# Patient Record
Sex: Male | Born: 1990 | Race: White | Hispanic: No | Marital: Single | Smoking: Never smoker
Health system: Southern US, Community
[De-identification: ages and names within clinical notes are randomized; demographics above are authoritative.]

---
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
## 2012-07-22 ENCOUNTER — Emergency Department (HOSPITAL_COMMUNITY): Payer: Self-pay

## 2012-07-22 ENCOUNTER — Encounter (HOSPITAL_COMMUNITY): Payer: Self-pay | Admitting: Emergency Medicine

## 2012-07-22 ENCOUNTER — Emergency Department (HOSPITAL_COMMUNITY)
Admission: EM | Admit: 2012-07-22 | Discharge: 2012-07-22 | Disposition: A | Discharge status: Home / Self Care | Payer: Self-pay | Attending: Emergency Medicine | Admitting: Emergency Medicine

## 2012-07-22 DIAGNOSIS — R0602 Shortness of breath: Secondary | ICD-10-CM | POA: Insufficient documentation

## 2012-07-22 LAB — POCT I-STAT, CHEM 8
BUN: 14 mg/dL (ref 6–23)
Calcium, Ion: 1.22 mmol/L (ref 1.12–1.23)
Chloride: 106 mEq/L (ref 96–112)
Creatinine, Ser: 0.7 mg/dL (ref 0.50–1.35)
Glucose, Bld: 103 mg/dL — ABNORMAL HIGH (ref 70–99)
HCT: 50 % (ref 39.0–52.0)
Hemoglobin: 17 g/dL (ref 13.0–17.0)
Potassium: 3.7 mEq/L (ref 3.5–5.1)
Sodium: 140 mEq/L (ref 135–145)
TCO2: 24 mmol/L (ref 0–100)

## 2012-07-22 LAB — D-DIMER, QUANTITATIVE (NOT AT ARMC): D-Dimer, Quant: 0.84 ug/mL-FEU — ABNORMAL HIGH (ref 0.00–0.48)

## 2012-07-22 MED ORDER — PREDNISONE 50 MG PO TABS: ORAL_TABLET | ORAL | Status: DC

## 2012-07-22 MED ORDER — ALBUTEROL SULFATE (5 MG/ML) 0.5% IN NEBU
5.0000 mg | INHALATION_SOLUTION | Freq: Once | RESPIRATORY_TRACT | Status: AC
  Administered 2012-07-22: 5 mg via RESPIRATORY_TRACT
  Filled 2012-07-22: qty 1

## 2012-07-22 MED ORDER — ALBUTEROL SULFATE HFA 108 (90 BASE) MCG/ACT IN AERS
2.0000 | INHALATION_SPRAY | RESPIRATORY_TRACT | Status: DC | PRN
  Administered 2012-07-22: 2 via RESPIRATORY_TRACT
  Filled 2012-07-22: qty 6.7

## 2012-07-22 MED ORDER — PREDNISONE 50 MG PO TABS
50.0000 mg | ORAL_TABLET | Freq: Once | ORAL | Status: AC
  Administered 2012-07-22: 50 mg via ORAL
  Filled 2012-07-22: qty 1

## 2012-07-22 MED ORDER — IOHEXOL 350 MG/ML SOLN
100.0000 mL | Freq: Once | INTRAVENOUS | Status: AC | PRN
  Administered 2012-07-22: 100 mL via INTRAVENOUS

## 2012-07-22 NOTE — ED Notes (Signed)
Patient transported to CT 

## 2012-07-22 NOTE — Progress Notes (Signed)
[facility] has seen patient and provided him with a list of primary care resources. °

## 2012-07-22 NOTE — ED Provider Notes (Signed)
History     CSN: [id number]  Arrival date & time 07/22/12  0865   First MD Initiated Contact with Patient 07/22/12 [phone]      Chief Complaint  Patient presents with  . Shortness of Breath    (Consider location/radiation/quality/duration/timing/severity/associated sxs/prior treatment) HPI Patient presents emergency room with complaints of shortness of breath, that occurred this morning.  Patient, states, that she's not had any cough, fever, nausea, vomiting, abdominal pain, chest pain, back pain, blurred vision, photophobia, weakness, or numbness.  Patient, states, that he did not take any medications prior to arrival.  Patient, states nothing seems to make his condition, better or worse. History reviewed. No pertinent past medical history.  History reviewed. No pertinent past surgical history.  No family history on file.  History  Substance Use Topics  . Smoking status: Never Smoker   . Smokeless tobacco: Not on file  . Alcohol Use: No      Review of Systems All other systems negative except as documented in the HPI. All pertinent positives and negatives as reviewed in the HPI. Allergies  Review of patient's allergies indicates not on file.  Home Medications  No current outpatient prescriptions on file.  BP 127/74  Pulse 81  Temp(Src) 98 F (36.7 C)  Resp 18  Ht 5\' 9"  (1.753 m)  Wt 190 lb (86.183 kg)  BMI 28.05 kg/m2  SpO2 99%  Physical Exam  Nursing note and vitals reviewed. Constitutional: He is oriented to person, place, and time. He appears well-developed and well-nourished.  HENT:  Head: Normocephalic and atraumatic.  Mouth/Throat: Oropharynx is clear and moist.  Eyes: Pupils are equal, round, and reactive to light.  Neck: Normal range of motion. Neck supple.  Cardiovascular: Normal rate, regular rhythm and normal heart sounds.  Exam reveals no gallop and no friction rub.   No murmur heard. Pulmonary/Chest: Effort normal and breath sounds normal. No  respiratory distress. He has no wheezes. He has no rales. He exhibits no tenderness.  Musculoskeletal: He exhibits no edema.  Neurological: He is alert and oriented to person, place, and time.  Skin: Skin is warm and dry. No rash noted.    ED Course  Procedures (including critical care time)  Labs Reviewed  D-DIMER, QUANTITATIVE - Abnormal; Notable for the following:    D-Dimer, Quant 0.84 (*)    All other components within normal limits  POCT I-STAT, CHEM 8 - Abnormal; Notable for the following:    Glucose, Bld 103 (*)    All other components within normal limits   Dg Chest 2 View  07/22/2012   *RADIOLOGY REPORT*  Clinical Data: Cough, shortness of breath and chest pain.  CHEST - 2 VIEW  Comparison: Chest x-ray from a rib series dated 08/08/2007.  Findings: There is no evidence of pneumothorax, infiltrate, edema or pleural fluid.  Cardiac and mediastinal contours are within normal limits.  Bony thorax is unremarkable.  IMPRESSION: No active disease.   Original Report Authenticated By: Irish Lack, M.D.   Ct Angio Chest Pe W/cm &/or Wo Cm  07/22/2012   *RADIOLOGY REPORT*  Clinical Data: Chest pain, cough, shortness of breath  CT ANGIOGRAPHY CHEST  Technique:  Multidetector CT imaging of the chest using the standard protocol during bolus administration of intravenous contrast. Multiplanar reconstructed images including MIPs were obtained and reviewed to evaluate the vascular anatomy.  Contrast: OMNIPAQUE IOHEXOL 350 MG/ML SOLN  Comparison: Chest radiographs dated 08/01/2012  Findings: No evidence of pulmonary embolism.  The lungs  are clear.  No focal consolidation.  No pleural effusion or pneumothorax.  Visualized thyroid is unremarkable.  The heart is normal in size.  No pericardial effusion.  The no suspicious mediastinal, hilar, or axillary lymphadenopathy.  Visualized upper abdomen is unremarkable.  Visualized osseous structures are within normal limits.  IMPRESSION: No evidence of  pulmonary embolism.  No evidence of acute cardiopulmonary disease.   Original Report Authenticated By: Charline Bills, M.D.    Patient has a negative CT of his chest .  For pulmonary embolus or other acute findings.  Patient does not have any wheezing, and a, states, that coughing, some.  He is given a neb treatment and steroid to attempt to ensure that his airways are completely open.  Patient's best return here as needed.  Told to followup with his primary care Dr.     MDM          Carlyle Dolly, PA-C 07/22/12 1409

## 2012-07-22 NOTE — ED Notes (Signed)
Pt states he has been short of breath for about 1 week and now states that today it has gotten worse. Denies any pain or other symptoms.

## 2012-07-26 NOTE — ED Provider Notes (Signed)
Medical screening examination/treatment/procedure(s) were performed by non-physician practitioner and as supervising physician I was immediately available for consultation/collaboration.  Toy Baker, MD 07/26/12 2011

## 2012-10-21 ENCOUNTER — Ambulatory Visit: Payer: Self-pay | Attending: Internal Medicine

## 2012-11-20 ENCOUNTER — Emergency Department (HOSPITAL_COMMUNITY)
Admission: EM | Admit: 2012-11-20 | Discharge: 2012-11-20 | Disposition: A | Discharge status: Home / Self Care | Payer: PRIVATE HEALTH INSURANCE | Source: Home / Self Care | Attending: Emergency Medicine | Admitting: Emergency Medicine

## 2012-11-20 ENCOUNTER — Encounter (HOSPITAL_COMMUNITY): Payer: Self-pay | Admitting: *Deleted

## 2012-11-20 DIAGNOSIS — B360 Pityriasis versicolor: Secondary | ICD-10-CM

## 2012-11-20 MED ORDER — KETOCONAZOLE 2 % EX CREA: TOPICAL_CREAM | Freq: Every day | CUTANEOUS | Status: AC

## 2012-11-20 NOTE — ED Provider Notes (Signed)
CSN: [id number]     Arrival date & time 11/20/12  1601 History   First MD Initiated Contact with Patient 11/20/12 1704     Chief Complaint  Patient presents with  . Rash   (Consider location/radiation/quality/duration/timing/severity/associated sxs/prior Treatment) HPI Comments: Patient reports history of an itching rash that started on bilateral upper arms approximately 2 weeks ago. Patient states he does not have a primary care provider.  Patient is a 22 y.o. male presenting with rash. The history is provided by the patient.  Rash   History reviewed. No pertinent past medical history. History reviewed. No pertinent past surgical history. History reviewed. No pertinent family history. History  Substance Use Topics  . Smoking status: Never Smoker   . Smokeless tobacco: Not on file  . Alcohol Use: No    Review of Systems  Constitutional: Negative.        Patient denies history of recent illness, malaise, fever, nausea, vomiting or diarrhea.  HENT: Negative.   Eyes: Negative.   Respiratory: Negative.   Cardiovascular: Negative.   Gastrointestinal: Negative.   Endocrine: Negative.   Genitourinary: Negative.   Musculoskeletal: Negative.   Skin: Positive for rash.       Patient states that the rash has spread to his arms to his back and chest. Patient states it is itchy.  Allergic/Immunologic: Negative.   Neurological: Negative.   Hematological: Negative.   Psychiatric/Behavioral: Negative.     Allergies  Review of patient's allergies indicates not on file.  Home Medications   Current Outpatient Rx  Name  Route  Sig  Dispense  Refill  . ketoconazole (NIZORAL) 2 % cream   Topical   Apply topically daily.   30 g   0   . predniSONE (DELTASONE) 50 MG tablet      1 tablet a day in the morning   5 tablet   0    BP 140/65  Pulse 82  Temp(Src) 98.2 F (36.8 C) (Oral)  Resp 18  SpO2 100%   Physical Exam  Nursing note and vitals reviewed. Constitutional: He is  oriented to person, place, and time. He appears well-developed and well-nourished. No distress.  HENT:  Head: Normocephalic and atraumatic.  Neck: Normal range of motion. Neck supple. No tracheal deviation present.  Cardiovascular: Normal rate, regular rhythm, normal heart sounds and intact distal pulses.  Exam reveals no gallop and no friction rub.   No murmur heard. Pulmonary/Chest: Effort normal and breath sounds normal. No respiratory distress. He has no wheezes. He has no rales. He exhibits no tenderness.  Lymphadenopathy:    He has no cervical adenopathy.  Neurological: He is alert and oriented to person, place, and time.  Skin: Skin is warm and dry. He is not diaphoretic.  Moderate erythema noted to right shoulder, patient states he has been scratching due to the intense itching. Large area resembling a  "Herald patch" on the right thoracic region of back. Stating that he looks at his back daily and that it absolutely started on his shoulders and upper arms.  Patient denies the presence of herald patch prior to development of other patches.  Rash is macular with hypopigmented areas surrounded by border of erythema.  It is scattered across shoulders and upper posterior surface of the arms and across the upper back. Mild flaking of skin noted.    ED Course  Procedures (including critical care time) Labs Review Labs Reviewed - No data to display Imaging Review No results found.  MDM  1. Tinea versicolor    Meds ordered this encounter  Medications  . ketoconazole (NIZORAL) 2 % cream    Sig: Apply topically daily.    Dispense:  30 g    Refill:  0   Plan of care discussed with patient and patient verbalizes understanding of the need for daily application for a full 2 weeks.  Pt to followup with dermatologist or [facility] if no improvement within the next 2 weeks.    Weber Cooks, NP 11/20/12 1737

## 2012-11-20 NOTE — ED Notes (Signed)
Pt  Has  A  Rash  On r  Upper  Arm      X  sev  Weeks  That  Does  Not    Itch          He  Also  Reports      A  Small  Cyst  On  The  l  Wrist     That is  Not  Painfull

## 2012-11-20 NOTE — ED Provider Notes (Signed)
Medical screening examination/treatment/procedure(s) were performed by non-physician practitioner and as supervising physician I was immediately available for consultation/collaboration.  Jamale Spangler, M.D.  Chrishauna Mee C Rheba Diamond, MD 11/20/12 1925 

## 2013-04-25 ENCOUNTER — Ambulatory Visit: Payer: Self-pay | Attending: Internal Medicine | Admitting: Internal Medicine

## 2013-04-25 ENCOUNTER — Encounter: Payer: Self-pay | Admitting: Internal Medicine

## 2013-04-25 VITALS — BP 145/75 | HR 71 | Temp 97.9°F | Resp 16 | Ht 69.0 in | Wt 177.0 lb

## 2013-04-25 DIAGNOSIS — B360 Pityriasis versicolor: Secondary | ICD-10-CM

## 2013-04-25 LAB — CBC WITH DIFFERENTIAL/PLATELET
Basophils Absolute: 0 10*3/uL (ref 0.0–0.1)
Basophils Relative: 0 % (ref 0–1)
Eosinophils Absolute: 0.2 10*3/uL (ref 0.0–0.7)
Eosinophils Relative: 4 % (ref 0–5)
HCT: 44.6 % (ref 39.0–52.0)
Hemoglobin: 15.5 g/dL (ref 13.0–17.0)
Lymphocytes Relative: 38 % (ref 12–46)
Lymphs Abs: 1.7 10*3/uL (ref 0.7–4.0)
MCH: 31.3 pg (ref 26.0–34.0)
MCHC: 34.8 g/dL (ref 30.0–36.0)
MCV: 90.1 fL (ref 78.0–100.0)
Monocytes Absolute: 0.5 10*3/uL (ref 0.1–1.0)
Monocytes Relative: 10 % (ref 3–12)
Neutro Abs: 2.2 10*3/uL (ref 1.7–7.7)
Neutrophils Relative %: 48 % (ref 43–77)
Platelets: 196 10*3/uL (ref 150–400)
RBC: 4.95 MIL/uL (ref 4.22–5.81)
RDW: 14 % (ref 11.5–15.5)
WBC: 4.6 10*3/uL (ref 4.0–10.5)

## 2013-04-25 LAB — SEDIMENTATION RATE: Sed Rate: 1 mm/hr (ref 0–16)

## 2013-04-25 LAB — TSH: TSH: 2.105 u[IU]/mL (ref 0.350–4.500)

## 2013-04-25 MED ORDER — TERBINAFINE HCL 1 % EX CREA: 1.0000 "application " | TOPICAL_CREAM | Freq: Two times a day (BID) | CUTANEOUS | Status: DC

## 2013-04-25 NOTE — Progress Notes (Signed)
Patient ID: Bryan Curry, male   DOB: Sep 19, 1990, 23 y.o.   MRN: [id number]   Bryan Curry, is a 23 y.o. male  [id number]:[id number]  [id number]:[id number]  DOB - 03-Dec-1990  CC:  Chief Complaint  Patient presents with  . Establish Care       HPI: Bryan Curry is a 23 y.o. male here today to establish medical care. Patient was recently seen in ER for skin rashes and was prescribed a tapered dose of prednisone but to no avail, he was also given Nizoral cream which did not help at all. Patient is here to establish care and to complain of the same rashes on the upper arms bilaterally, upper back and sometimes upper chest. Occasionally itchy, started about 6 months ago, sometimes comes and goes but lately has been persistent. No similar rash in other members of family, patient denies any contact. No joint pains or swellings. Patient also complained of occasional dizziness and generalized tiredness. No cold intolerance or heat intolerance, no personal history of autoimmune disorder or family history of such. Patient's symptoms has no pattern, no known relieving or aggravating factors. In the past few days no symptoms at all, patient is not able to predict when symptom comes but are uncomfortable feeling. Patient has No headache, No chest pain, No abdominal pain - No Nausea, No new weakness tingling or numbness, No Cough - SOB.  No Known Allergies History reviewed. No pertinent past medical history. Current Outpatient Prescriptions on File Prior to Visit  Medication Sig Dispense Refill  . predniSONE (DELTASONE) 50 MG tablet 1 tablet a day in the morning  5 tablet  0   No current facility-administered medications on file prior to visit.   Family History  Problem Relation Age of Onset  . Cancer Paternal Grandfather    History   Social History  . Marital Status: Single    Spouse Name: N/A    Number of Children: N/A  . Years of Education: N/A   Occupational History  . Not on file.   Social  History Main Topics  . Smoking status: Never Smoker   . Smokeless tobacco: Not on file  . Alcohol Use: No  . Drug Use: No  . Sexual Activity: Yes   Other Topics Concern  . Not on file   Social History Narrative  . No narrative on file    Review of Systems: Constitutional: Negative for fever, chills, diaphoresis, activity change, appetite change and fatigue. HENT: Negative for ear pain, nosebleeds, congestion, facial swelling, rhinorrhea, neck pain, neck stiffness and ear discharge.  Eyes: Negative for pain, discharge, redness, itching and visual disturbance. Respiratory: Negative for cough, choking, chest tightness, shortness of breath, wheezing and stridor.  Cardiovascular: Negative for chest pain, palpitations and leg swelling. Gastrointestinal: Negative for abdominal distention. Genitourinary: Negative for dysuria, urgency, frequency, hematuria, flank pain, decreased urine volume, difficulty urinating and dyspareunia.  Musculoskeletal: Negative for back pain, joint swelling, arthralgia and gait problem. Neurological: Negative for dizziness, tremors, seizures, syncope, facial asymmetry, speech difficulty, weakness, light-headedness, numbness and headaches.  Hematological: Negative for adenopathy. Does not bruise/bleed easily. Psychiatric/Behavioral: Negative for hallucinations, behavioral problems, confusion, dysphoric mood, decreased concentration and agitation.    Objective:   Filed Vitals:   04/25/13 1028  BP: 145/75  Pulse: 71  Temp: 97.9 F (36.6 C)  Resp: 16    Physical Exam: Constitutional: Patient appears well-developed and well-nourished. No distress. HENT: Normocephalic, atraumatic, External right and left ear normal. Oropharynx is clear and moist.  Eyes: Conjunctivae and EOM are normal. PERRLA, no scleral icterus. Neck: Normal ROM. Neck supple. No JVD. No tracheal deviation. No thyromegaly. CVS: RRR, S1/S2 +, no murmurs, no gallops, no carotid bruit.    Pulmonary: Effort and breath sounds normal, no stridor, rhonchi, wheezes, rales.  Abdominal: Soft. BS +, no distension, tenderness, rebound or guarding.  Musculoskeletal: Normal range of motion. No edema and no tenderness.  Lymphadenopathy: No lymphadenopathy noted, cervical, inguinal or axillary Neuro: Alert. Normal reflexes, muscle tone coordination. No cranial nerve deficit. Skin: There are macular rashes with hypopigmentations surrounded by border of erythema. It is scattered across shoulders and upper posterior surface of the arms and across the upper back. Mild flaking of skin noted.  Skin is warm and dry. No rash noted. Not diaphoretic. No erythema. No pallor. Psychiatric: Normal mood and affect. Behavior, judgment, thought content normal.  Lab Results  Component Value Date   HGB 17.0 07/22/2012   HCT 50.0 07/22/2012   Lab Results  Component Value Date   CREATININE 0.70 07/22/2012   BUN 14 07/22/2012   NA 140 07/22/2012   K 3.7 07/22/2012   CL 106 07/22/2012    No results found for this basename: HGBA1C   Lipid Panel  No results found for this basename: chol, trig, hdl, cholhdl, vldl, ldlcalc       Assessment and plan:   1. Tinea versicolor  - terbinafine (LAMISIL) 1 % cream; Apply 1 application topically 2 (two) times daily.  Dispense: 30 g; Refill: 2 - CBC with Differential - Sedimentation Rate - ANA - TSH  Patient has been extensively counseled about personal hygiene Patient was instructed to return if symptoms of dizziness or chest discomfort reoccurs  Return in about 6 months (around 10/26/2013), or if symptoms worsen or fail to improve, for Skin Disease.  The patient was given clear instructions to go to ER or return to medical center if symptoms don't improve, worsen or new problems develop. The patient verbalized understanding. The patient was told to call to get lab results if they haven't heard anything in the next week.     This note has been created with [employer]  speech recognition software and smart phrase technology. Any transcriptional errors are unintentional.    Jeanann LewandowskyJEGEDE, Jillaine Waren, MD, MHA, FACP, FAAP [facility] [place], [place] [phone]   04/25/2013, 11:43 AM

## 2013-04-25 NOTE — Patient Instructions (Signed)
Tinea Versicolor  Tinea versicolor is a common yeast infection of the skin. This condition becomes known when the yeast on our skin starts to overgrow (yeast is a normal inhabitant on our skin). This condition is noticed as white or light brown patches on brown skin, and is more evident in the summer on tanned skin. These areas are slightly scaly if scratched. The light patches from the yeast become evident when the yeast creates "holes in your suntan". This is most often noticed in the summer. The patches are usually located on the chest, back, pubis, neck and body folds. However, it may occur on any area of body. Mild itching and inflammation (redness or soreness) may be present.  DIAGNOSIS   The diagnosisof this is made clinically (by looking). Cultures from samples are usually not needed. Examination under the microscope may help. However, yeast is normally found on skin. The diagnosis still remains clinical. Examination under Wood's Ultraviolet Light can determine the extent of the infection.  TREATMENT   This common infection is usually only of cosmetic (only a concern to your appearance). It is easily treated with dandruff shampoo used during showers or bathing. Vigorous scrubbing will eliminate the yeast over several days time. The light areas in your skin may remain for weeks or months after the infection is cured unless your skin is exposed to sunlight. The lighter or darker spots caused by the fungus that remain after complete treatment are not a sign of treatment failure; it will take a long time to resolve. Your caregiver may recommend a number of commercial preparations or medication by mouth if home care is not working. Recurrence is common and preventative medication may be necessary.  This skin condition is not highly contagious. Special care is not needed to protect close friends and family members. Normal hygiene is usually enough. Follow up is required only if you develop complications (such as a  secondary infection from scratching), if recommended by your caregiver, or if no relief is obtained from the preparations used.  Document Released: 01/31/2000 Document Revised: 04/27/2011 Document Reviewed: 03/14/2008  Exit[employer] Patient Information 2014 [employer].

## 2013-04-25 NOTE — Progress Notes (Signed)
Pt is here to establish care. Pt reports having trouble breathing. Pt has discolored white spot on his arms. He also has a painful knot on his wrist.

## 2013-04-26 LAB — ANA: Anti Nuclear Antibody(ANA): NEGATIVE

## 2013-05-02 ENCOUNTER — Telehealth: Payer: Self-pay | Admitting: Emergency Medicine

## 2013-05-02 NOTE — Telephone Encounter (Signed)
Left message for pt to call clinic when message received 

## 2013-05-02 NOTE — Telephone Encounter (Signed)
Message copied by Darlis LoanSMITH, Deshunda Thackston D on Tue May 02, 2013 11:22 AM ------      Message from: Quentin AngstJEGEDE, OLUGBEMIGA E      Created: Sun Apr 30, 2013  8:53 PM       Please call to inform patient that all his laboratory tests results are normal ------

## 2013-10-22 ENCOUNTER — Emergency Department (HOSPITAL_COMMUNITY)
Admission: EM | Admit: 2013-10-22 | Discharge: 2013-10-22 | Disposition: A | Discharge status: Home / Self Care | Payer: No Typology Code available for payment source | Attending: Emergency Medicine | Admitting: Emergency Medicine

## 2013-10-22 ENCOUNTER — Encounter (HOSPITAL_COMMUNITY): Payer: Self-pay | Admitting: Emergency Medicine

## 2013-10-22 DIAGNOSIS — H5710 Ocular pain, unspecified eye: Secondary | ICD-10-CM | POA: Diagnosis present

## 2013-10-22 DIAGNOSIS — H16133 Photokeratitis, bilateral: Secondary | ICD-10-CM

## 2013-10-22 DIAGNOSIS — H16139 Photokeratitis, unspecified eye: Secondary | ICD-10-CM | POA: Insufficient documentation

## 2013-10-22 MED ORDER — CYCLOPENTOLATE HCL 1 % OP SOLN
1.0000 [drp] | Freq: Once | OPHTHALMIC | Status: AC
  Administered 2013-10-22: 1 [drp] via OPHTHALMIC
  Filled 2013-10-22: qty 2

## 2013-10-22 MED ORDER — TETRACAINE HCL 0.5 % OP SOLN
2.0000 [drp] | Freq: Once | OPHTHALMIC | Status: DC
Start: 2013-10-22 — End: 2013-10-22
  Filled 2013-10-22: qty 2

## 2013-10-22 MED ORDER — OXYCODONE-ACETAMINOPHEN 5-325 MG PO TABS
1.0000 | ORAL_TABLET | Freq: Once | ORAL | Status: AC
  Administered 2013-10-22: 1 via ORAL
  Filled 2013-10-22: qty 1

## 2013-10-22 MED ORDER — ERYTHROMYCIN 5 MG/GM OP OINT: TOPICAL_OINTMENT | OPHTHALMIC | Status: DC

## 2013-10-22 MED ORDER — HYDROCODONE-ACETAMINOPHEN 5-325 MG PO TABS: 1.0000 | ORAL_TABLET | Freq: Four times a day (QID) | ORAL | Status: DC | PRN

## 2013-10-22 MED ORDER — FLUORESCEIN SODIUM 1 MG OP STRP
1.0000 | ORAL_STRIP | Freq: Once | OPHTHALMIC | Status: DC
  Filled 2013-10-22: qty 1

## 2013-10-22 NOTE — ED Notes (Signed)
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

## 2013-10-22 NOTE — ED Provider Notes (Signed)
CSN: [id number]     Arrival date & time 10/22/13  7829 History   First MD Initiated Contact with Patient 10/22/13 0601     Chief Complaint  Patient presents with  . Eye Pain     (Consider location/radiation/quality/duration/timing/severity/associated sxs/prior Treatment) HPI  23 year old male presents complaining of his eyes pain. 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 He did not wear his eyes shield and subsequently experienced acute onset of sharp achy pain to both eyes, right greater than left with a flash from the welding torch. Since then he endorsed 10 of 10 high pain, with photophobia and blurry vision. His eyes has been teary. Denies fb sensation, does not think anything flew into his eyes.  No specific treatment tried. Patient does not wear contact lens. Patient does not weld on a regular basis.     History reviewed. No pertinent past medical history. History reviewed. No pertinent past surgical history. Family History  Problem Relation Age of Onset  . Cancer Paternal Grandfather    History  Substance Use Topics  . Smoking status: Never Smoker   . Smokeless tobacco: Not on file  . Alcohol Use: No    Review of Systems  Constitutional: Negative for fever.  Eyes: Positive for photophobia, pain, redness and visual disturbance. Negative for discharge and itching.      Allergies  Review of patient's allergies indicates no known allergies.  Home Medications   Prior to Admission medications   Not on File   BP 129/76  Pulse 73  Temp(Src) 97.7 F (36.5 C) (Oral)  Resp 18  Ht  (1.753 m)  Wt 180 lb (81.647 kg)  BMI 26.57 kg/m2  SpO2 100% Physical Exam  Constitutional: He appears well-developed and well-nourished. No distress.  HENT:  Head: Atraumatic.  Eyes: EOM and lids are normal. Pupils are equal, round, and reactive to light. Lids are everted and swept, no foreign bodies found. Right eye exhibits no chemosis, no  discharge, no exudate and no hordeolum. No foreign body present in the right eye. Left eye exhibits exudate. Left eye exhibits no chemosis, no discharge and no hordeolum. No foreign body present in the left eye. Right conjunctiva is injected. Right conjunctiva has no hemorrhage. Left conjunctiva is injected. Left conjunctiva has no hemorrhage. No scleral icterus.  Slit lamp exam:      The right eye shows no corneal abrasion, no corneal flare, no corneal ulcer, no foreign body, no hyphema, no hypopyon and no fluorescein uptake.       The left eye shows no corneal abrasion, no corneal flare, no corneal ulcer, no foreign body, no hyphema, no hypopyon and no fluorescein uptake.  Visual Acuity Visual Acuity - Bilateral Distance: 20/30 ; R Distance: 20/50 ; L Distance: 20/30   Neck: Normal range of motion. Neck supple.  Neurological: He is alert.  Skin: No rash noted.  Psychiatric: He has a normal mood and affect.    ED Course  Procedures (including critical care time)   7:18 AM Patient here with bilateral eye pain from UV keratitis. Improvement of pain after application of tetracaine eyedrops. Aside from mild injected conjunctiva to both eyes, no other significant finding. Plan to treat with erythromycin ointment, cyclopentolate to relieve pain of the ciliary muscle spasm, narcotic pain medication, and close followup with a ophthalmologist. Strong recommendation to always use eye protective gear.  REturn precaution discussed.  Pt is UTD with immunization.    Labs  Review Labs Reviewed - No data to display  Imaging Review No results found.   EKG Interpretation None      MDM   Final diagnoses:  UV keratitis, bilateral    BP 118/77  Pulse 73  Temp(Src) 97.7 F (36.5 C) (Oral)  Resp 18  Ht  (1.753 m)  Wt 180 lb (81.647 kg)  BMI 26.57 kg/m2  SpO2 100%      Fayrene Helper, PA-C 10/22/13 [phone]

## 2013-10-22 NOTE — Discharge Instructions (Signed)
Apply a bead of erythromycin ointment to the lower lid of both eyes every 8-12 hrs for the next 7 days to help provide protection to your eye.  Take pain medication as needed.  Follow up with eye specialist in the next 24-72 hrs.  Return if pain worsen.  Always wear protective gearing when appropriate.    Ultraviolet Keratitis Ultraviolet keratitis can occur when too much UV light enters the cornea. The cornea is the clear cover on the front part of your eye that helps focus light. It protects your eyes from dust and other foreign bodies and filters ultraviolet rays. This condition can be caused by exposure to snow (snow blindness) from the reflected or direct sunlight. It may also be caused by exposure to welding arcs or halogen lamps (flash burn) and prolonged exposure to direct sunlight. Brief exposure can result in severe problems several hours later. CAUSES  Causes of ultraviolet keratitis include:  Exposure to snow (snow blindness) from the reflected or direct sunlight.  Exposure to welding arcs or halogen lamps (flash burn).  Prolonged exposure to direct sunlight. SYMPTOMS  The symptoms of ultraviolet keratitis usually start 6 to 12 hours after the damage occurred. They may include the following:   Tearing.  Light sensitivity.  Gritty sensation in eyes.  Swelling of your eyelids.  Severe pain. In spite of the pain, this condition will usually improve within 24 hours even without treatment. HOME CARE INSTRUCTIONS   Apply cold packs on your eyes to ease the pain.  Only take over-the-counter or prescription medicines for pain, discomfort, or fever as directed by your caregiver.  Your caregiver may also prescribe an antibiotic ointment to help prevent infection and/or additional medications for pain relief.  Apply an eye patch to help relieve discomfort and assist in healing. If your caregiver patches your eyes, it is important to leave these patches on.  Follow the instructions  given to you by your caregiver.  Do not rub your eyes.  If your caregiver has given you a follow-up appointment, it is very important to keep that appointment. Not keeping the appointment could result in a severe eye infection or permanent loss of vision. If there is any problem keeping the appointment, you must call back to this facility for assistance. SEEK IMMEDIATE MEDICAL CARE IF:   Pain is severe and not relieved by medication.  Pain or problems with vision last more than 48 hours.  Exposure to light is unavoidable and you need extra protection for your eyes. MAKE SURE YOU:   Understand these instructions.  Will watch your condition.  Will get help right away if you are not doing well or get worse. Document Released: 02/02/2005 Document Revised: 06/19/2013 Document Reviewed: 09/09/2007 [facility] Patient Information 2015 [place], [place]. This information is not intended to replace advice given to you by your health care provider. Make sure you discuss any questions you have with your health care provider.

## 2013-10-23 NOTE — ED Provider Notes (Signed)
Medical screening examination/treatment/procedure(s) were performed by non-physician practitioner and as supervising physician I was immediately available for consultation/collaboration.   EKG Interpretation None        Frantz Quattrone, MD 10/23/13 0507 

## 2014-08-14 ENCOUNTER — Emergency Department (HOSPITAL_COMMUNITY): Payer: 59

## 2014-08-14 ENCOUNTER — Emergency Department (HOSPITAL_COMMUNITY)
Admission: EM | Admit: 2014-08-14 | Discharge: 2014-08-14 | Disposition: A | Discharge status: Home / Self Care | Payer: 59 | Attending: Emergency Medicine | Admitting: Emergency Medicine

## 2014-08-14 ENCOUNTER — Encounter (HOSPITAL_COMMUNITY): Payer: Self-pay | Admitting: Neurology

## 2014-08-14 DIAGNOSIS — Z79899 Other long term (current) drug therapy: Secondary | ICD-10-CM | POA: Diagnosis not present

## 2014-08-14 DIAGNOSIS — M79604 Pain in right leg: Secondary | ICD-10-CM | POA: Diagnosis not present

## 2014-08-14 DIAGNOSIS — T59811A Toxic effect of smoke, accidental (unintentional), initial encounter: Secondary | ICD-10-CM

## 2014-08-14 DIAGNOSIS — J705 Respiratory conditions due to smoke inhalation: Secondary | ICD-10-CM | POA: Diagnosis not present

## 2014-08-14 DIAGNOSIS — M25521 Pain in right elbow: Secondary | ICD-10-CM | POA: Diagnosis not present

## 2014-08-14 DIAGNOSIS — M79606 Pain in leg, unspecified: Secondary | ICD-10-CM

## 2014-08-14 DIAGNOSIS — R0602 Shortness of breath: Secondary | ICD-10-CM | POA: Diagnosis present

## 2014-08-14 LAB — COMPREHENSIVE METABOLIC PANEL
ALT: 19 U/L (ref 17–63)
AST: 22 U/L (ref 15–41)
Albumin: 4.3 g/dL (ref 3.5–5.0)
Alkaline Phosphatase: 65 U/L (ref 38–126)
Anion gap: 8 (ref 5–15)
BUN: 18 mg/dL (ref 6–20)
CO2: 21 mmol/L — ABNORMAL LOW (ref 22–32)
Calcium: 9.4 mg/dL (ref 8.9–10.3)
Chloride: 110 mmol/L (ref 101–111)
Creatinine, Ser: 0.98 mg/dL (ref 0.61–1.24)
GFR calc Af Amer: >60 mL/min (ref >60)
GFR calc non Af Amer: >60 mL/min (ref >60)
Glucose, Bld: 113 mg/dL — ABNORMAL HIGH (ref 65–99)
Potassium: 3.5 mmol/L (ref 3.5–5.1)
Sodium: 139 mmol/L (ref 135–145)
Total Bilirubin: 0.9 mg/dL (ref 0.3–1.2)
Total Protein: 7 g/dL (ref 6.5–8.1)

## 2014-08-14 LAB — CARBOXYHEMOGLOBIN
Carboxyhemoglobin: 0.7 % (ref 0.5–1.5)
Methemoglobin: 0.9 % (ref 0.0–1.5)
O2 Saturation: 97.2 %
Total hemoglobin: 16.6 g/dL (ref 13.5–18.0)

## 2014-08-14 LAB — CBC
HCT: 46.4 % (ref 39.0–52.0)
Hemoglobin: 16.8 g/dL (ref 13.0–17.0)
MCH: 32.1 pg (ref 26.0–34.0)
MCHC: 36.2 g/dL — ABNORMAL HIGH (ref 30.0–36.0)
MCV: 88.7 fL (ref 78.0–100.0)
Platelets: 174 10*3/uL (ref 150–400)
RBC: 5.23 MIL/uL (ref 4.22–5.81)
RDW: 12.5 % (ref 11.5–15.5)
WBC: 8.2 10*3/uL (ref 4.0–10.5)

## 2014-08-14 LAB — PROTIME-INR
INR: 1.22 (ref 0.00–1.49)
Prothrombin Time: 15.5 seconds — ABNORMAL HIGH (ref 11.6–15.2)

## 2014-08-14 LAB — PREPARE FRESH FROZEN PLASMA
Unit division: 0
Unit division: 0

## 2014-08-14 LAB — TYPE AND SCREEN
ABO/RH(D): O POS
Antibody Screen: NEGATIVE
Unit division: 0
Unit division: 0

## 2014-08-14 LAB — I-STAT ARTERIAL BLOOD GAS, ED
Acid-base deficit: 2 mmol/L (ref 0.0–2.0)
Bicarbonate: 22 mEq/L (ref 20.0–24.0)
O2 Saturation: 97 %
TCO2: 23 mmol/L (ref 0–100)
pCO2 arterial: 36.1 mmHg (ref 35.0–45.0)
pH, Arterial: 7.394 (ref 7.350–7.450)
pO2, Arterial: 89 mmHg (ref 80.0–100.0)

## 2014-08-14 LAB — CK: Total CK: 142 U/L (ref 49–397)

## 2014-08-14 LAB — ETHANOL: Alcohol, Ethyl (B): <5 mg/dL (ref <5)

## 2014-08-14 LAB — ABO/RH: ABO/RH(D): O POS

## 2014-08-14 LAB — CDS SEROLOGY

## 2014-08-14 MED ORDER — DEXAMETHASONE SODIUM PHOSPHATE 10 MG/ML IJ SOLN
10.0000 mg | Freq: Once | INTRAMUSCULAR | Status: AC
  Administered 2014-08-14: 10 mg via INTRAVENOUS
  Filled 2014-08-14: qty 1

## 2014-08-14 NOTE — ED Notes (Signed)
No neuro or ortho consult.

## 2014-08-14 NOTE — ED Notes (Signed)
While ambulating pt with portable SPO2, pt SPO2 results were between 97%-99%. Informed physician.

## 2014-08-14 NOTE — ED Notes (Signed)
Pt c/o pain to right knee and right elbow pain, feels decreased sensation. No injuries visible.
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
## 2014-08-14 NOTE — ED Notes (Signed)
Patient transported to X-ray 

## 2014-08-14 NOTE — Discharge Instructions (Signed)
Cough, Adult  A cough is a reflex that helps clear your throat and airways. It can help heal the body or may be a reaction to an irritated airway. A cough may only last 2 or 3 weeks (acute) or may last more than 8 weeks (chronic).  CAUSES Acute cough:  Viral or bacterial infections. Chronic cough:  Infections.  Allergies.  Asthma.  Post-nasal drip.  Smoking.  Heartburn or acid reflux.  Some medicines.  Chronic lung problems (COPD).  Cancer. SYMPTOMS   Cough.  Fever.  Chest pain.  Increased breathing rate.  High-pitched whistling sound when breathing (wheezing).  Colored mucus that you cough up (sputum). TREATMENT   A bacterial cough may be treated with antibiotic medicine.  A viral cough must run its course and will not respond to antibiotics.  Your caregiver may recommend other treatments if you have a chronic cough. HOME CARE INSTRUCTIONS   Only take over-the-counter or prescription medicines for pain, discomfort, or fever as directed by your caregiver. Use cough suppressants only as directed by your caregiver.  Use a cold steam vaporizer or humidifier in your bedroom or home to help loosen secretions.  Sleep in a semi-upright position if your cough is worse at night.  Rest as needed.  Stop smoking if you smoke. SEEK IMMEDIATE MEDICAL CARE IF:   You have pus in your sputum.  Your cough starts to worsen.  You cannot control your cough with suppressants and are losing sleep.  You begin coughing up blood.  You have difficulty breathing.  You develop pain which is getting worse or is uncontrolled with medicine.  You have a fever. MAKE SURE YOU:   Understand these instructions.  Will watch your condition.  Will get help right away if you are not doing well or get worse. Document Released: 08/01/2010 Document Revised: 04/27/2011 Document Reviewed: 08/01/2010 [employer] Patient Information 2015 [employer]. This information is not intended  to replace advice given to you by your health care provider. Make sure you discuss any questions you have with your health care provider.  

## 2014-08-15 ENCOUNTER — Encounter (HOSPITAL_COMMUNITY): Payer: Self-pay | Admitting: Emergency Medicine

## 2014-10-20 ENCOUNTER — Other Ambulatory Visit: Payer: Self-pay

## 2014-10-20 ENCOUNTER — Emergency Department (HOSPITAL_COMMUNITY): Payer: 59

## 2014-10-20 ENCOUNTER — Inpatient Hospital Stay (HOSPITAL_COMMUNITY): Payer: 59

## 2014-10-20 ENCOUNTER — Inpatient Hospital Stay (HOSPITAL_COMMUNITY)
Admission: EM | Admit: 2014-10-20 | Discharge: 2014-10-21 | DRG: 101 | Disposition: A | Discharge status: Home / Self Care | Payer: 59 | Attending: Internal Medicine | Admitting: Internal Medicine

## 2014-10-20 ENCOUNTER — Encounter (HOSPITAL_COMMUNITY): Payer: Self-pay | Admitting: *Deleted

## 2014-10-20 DIAGNOSIS — F10129 Alcohol abuse with intoxication, unspecified: Secondary | ICD-10-CM | POA: Diagnosis present

## 2014-10-20 DIAGNOSIS — G4089 Other seizures: Secondary | ICD-10-CM | POA: Diagnosis present

## 2014-10-20 DIAGNOSIS — J45909 Unspecified asthma, uncomplicated: Secondary | ICD-10-CM | POA: Diagnosis present

## 2014-10-20 DIAGNOSIS — Y907 Blood alcohol level of 200-239 mg/100 ml: Secondary | ICD-10-CM | POA: Diagnosis present

## 2014-10-20 DIAGNOSIS — F1010 Alcohol abuse, uncomplicated: Secondary | ICD-10-CM

## 2014-10-20 DIAGNOSIS — R569 Unspecified convulsions: Secondary | ICD-10-CM | POA: Diagnosis not present

## 2014-10-20 DIAGNOSIS — Q048 Other specified congenital malformations of brain: Secondary | ICD-10-CM

## 2014-10-20 DIAGNOSIS — R4182 Altered mental status, unspecified: Secondary | ICD-10-CM | POA: Diagnosis not present

## 2014-10-20 DIAGNOSIS — E874 Mixed disorder of acid-base balance: Secondary | ICD-10-CM | POA: Diagnosis present

## 2014-10-20 DIAGNOSIS — I959 Hypotension, unspecified: Secondary | ICD-10-CM | POA: Diagnosis present

## 2014-10-20 LAB — URINALYSIS, ROUTINE W REFLEX MICROSCOPIC
Bilirubin Urine: NEGATIVE
Glucose, UA: NEGATIVE mg/dL
Hgb urine dipstick: NEGATIVE
Ketones, ur: NEGATIVE mg/dL
Leukocytes, UA: NEGATIVE
Nitrite: NEGATIVE
Protein, ur: NEGATIVE mg/dL
Specific Gravity, Urine: 1.019 (ref 1.005–1.030)
Urobilinogen, UA: 0.2 mg/dL (ref 0.0–1.0)
pH: 5 (ref 5.0–8.0)

## 2014-10-20 LAB — COMPREHENSIVE METABOLIC PANEL
ALT: 21 U/L (ref 17–63)
AST: 24 U/L (ref 15–41)
Albumin: 4.4 g/dL (ref 3.5–5.0)
Alkaline Phosphatase: 63 U/L (ref 38–126)
Anion gap: 15 (ref 5–15)
BUN: 11 mg/dL (ref 6–20)
CO2: 19 mmol/L — ABNORMAL LOW (ref 22–32)
Calcium: 8.7 mg/dL — ABNORMAL LOW (ref 8.9–10.3)
Chloride: 106 mmol/L (ref 101–111)
Creatinine, Ser: 0.77 mg/dL (ref 0.61–1.24)
GFR calc Af Amer: >60 mL/min (ref >60)
GFR calc non Af Amer: >60 mL/min (ref >60)
Glucose, Bld: 99 mg/dL (ref 65–99)
Potassium: 3.8 mmol/L (ref 3.5–5.1)
Sodium: 140 mmol/L (ref 135–145)
Total Bilirubin: 0.4 mg/dL (ref 0.3–1.2)
Total Protein: 7.4 g/dL (ref 6.5–8.1)

## 2014-10-20 LAB — RAPID URINE DRUG SCREEN, HOSP PERFORMED
Amphetamines: NOT DETECTED
Barbiturates: NOT DETECTED
Benzodiazepines: POSITIVE — AB
Cocaine: NOT DETECTED
Opiates: NOT DETECTED
Tetrahydrocannabinol: NOT DETECTED

## 2014-10-20 LAB — CBC
HCT: 47.9 % (ref 39.0–52.0)
Hemoglobin: 17 g/dL (ref 13.0–17.0)
MCH: 32.7 pg (ref 26.0–34.0)
MCHC: 35.5 g/dL (ref 30.0–36.0)
MCV: 92.1 fL (ref 78.0–100.0)
Platelets: 186 10*3/uL (ref 150–400)
RBC: 5.2 MIL/uL (ref 4.22–5.81)
RDW: 12.4 % (ref 11.5–15.5)
WBC: 6.9 10*3/uL (ref 4.0–10.5)

## 2014-10-20 LAB — I-STAT ARTERIAL BLOOD GAS, ED
Acid-base deficit: 5 mmol/L — ABNORMAL HIGH (ref 0.0–2.0)
Acid-base deficit: 8 mmol/L — ABNORMAL HIGH (ref 0.0–2.0)
Bicarbonate: 22.7 mEq/L (ref 20.0–24.0)
Bicarbonate: 19.7 mEq/L — ABNORMAL LOW (ref 20.0–24.0)
O2 Saturation: 99 %
O2 Saturation: 95 %
Patient temperature: 96
Patient temperature: 96.4
TCO2: 24 mmol/L (ref 0–100)
TCO2: 21 mmol/L (ref 0–100)
pCO2 arterial: 49.1 mmHg — ABNORMAL HIGH (ref 35.0–45.0)
pCO2 arterial: 43.8 mmHg (ref 35.0–45.0)
pH, Arterial: 7.265 — ABNORMAL LOW (ref 7.350–7.450)
pH, Arterial: 7.254 — ABNORMAL LOW (ref 7.350–7.450)
pO2, Arterial: 135 mmHg — ABNORMAL HIGH (ref 80.0–100.0)
pO2, Arterial: 83 mmHg (ref 80.0–100.0)

## 2014-10-20 LAB — DIFFERENTIAL
Basophils Absolute: 0 10*3/uL (ref 0.0–0.1)
Basophils Relative: 0 % (ref 0–1)
Eosinophils Absolute: 0.3 10*3/uL (ref 0.0–0.7)
Eosinophils Relative: 5 % (ref 0–5)
Lymphocytes Relative: 35 % (ref 12–46)
Lymphs Abs: 2.4 10*3/uL (ref 0.7–4.0)
Monocytes Absolute: 0.4 10*3/uL (ref 0.1–1.0)
Monocytes Relative: 5 % (ref 3–12)
Neutro Abs: 3.8 10*3/uL (ref 1.7–7.7)
Neutrophils Relative %: 55 % (ref 43–77)

## 2014-10-20 LAB — I-STAT CG4 LACTIC ACID, ED: Lactic Acid, Venous: 1.7 mmol/L (ref 0.5–2.0)

## 2014-10-20 LAB — SALICYLATE LEVEL: Salicylate Lvl: <4 mg/dL (ref 2.8–30.0)

## 2014-10-20 LAB — I-STAT CHEM 8, ED
BUN: 14 mg/dL (ref 6–20)
Calcium, Ion: 1.07 mmol/L — ABNORMAL LOW (ref 1.12–1.23)
Chloride: 106 mmol/L (ref 101–111)
Creatinine, Ser: 1 mg/dL (ref 0.61–1.24)
Glucose, Bld: 101 mg/dL — ABNORMAL HIGH (ref 65–99)
HCT: 51 % (ref 39.0–52.0)
Hemoglobin: 17.3 g/dL — ABNORMAL HIGH (ref 13.0–17.0)
Potassium: 3.9 mmol/L (ref 3.5–5.1)
Sodium: 142 mmol/L (ref 135–145)
TCO2: 20 mmol/L (ref 0–100)

## 2014-10-20 LAB — MRSA PCR SCREENING: MRSA by PCR: NEGATIVE

## 2014-10-20 LAB — ACETAMINOPHEN LEVEL: Acetaminophen (Tylenol), Serum: <10 ug/mL — ABNORMAL LOW (ref 10–30)

## 2014-10-20 LAB — CG4 I-STAT (LACTIC ACID): Lactic Acid, Venous: 2.94 mmol/L (ref 0.5–2.0)

## 2014-10-20 LAB — CBG MONITORING, ED: Glucose-Capillary: 91 mg/dL (ref 65–99)

## 2014-10-20 LAB — ETHANOL: Alcohol, Ethyl (B): 209 mg/dL — ABNORMAL HIGH (ref <5)

## 2014-10-20 LAB — TROPONIN I: Troponin I: <0.03 ng/mL (ref <0.031)

## 2014-10-20 LAB — CK: Total CK: 172 U/L (ref 49–397)

## 2014-10-20 MED ORDER — SODIUM CHLORIDE 0.9 % IV SOLN
1000.0000 mg | Freq: Once | INTRAVENOUS | Status: AC
  Administered 2014-10-20: 1000 mg via INTRAVENOUS
  Filled 2014-10-20: qty 10

## 2014-10-20 MED ORDER — GADOBENATE DIMEGLUMINE 529 MG/ML IV SOLN
17.0000 mL | Freq: Once | INTRAVENOUS | Status: AC | PRN
  Administered 2014-10-20: 17 mL via INTRAVENOUS

## 2014-10-20 MED ORDER — ALBUTEROL SULFATE (2.5 MG/3ML) 0.083% IN NEBU: 2.5000 mg | INHALATION_SOLUTION | Freq: Four times a day (QID) | RESPIRATORY_TRACT | Status: DC | PRN

## 2014-10-20 MED ORDER — ENOXAPARIN SODIUM 40 MG/0.4ML ~~LOC~~ SOLN
40.0000 mg | SUBCUTANEOUS | Status: DC
  Administered 2014-10-20: 40 mg via SUBCUTANEOUS
  Filled 2014-10-20 (×2): qty 0.4

## 2014-10-20 MED ORDER — SODIUM CHLORIDE 0.9 % IV SOLN
INTRAVENOUS | Status: DC
  Administered 2014-10-20: 08:00:00 via INTRAVENOUS

## 2014-10-20 MED ORDER — ALBUTEROL SULFATE (2.5 MG/3ML) 0.083% IN NEBU: 2.5000 mg | INHALATION_SOLUTION | RESPIRATORY_TRACT | Status: DC | PRN

## 2014-10-20 MED ORDER — SODIUM CHLORIDE 0.9 % IV SOLN: 75.0000 mL/h | INTRAVENOUS | Status: DC

## 2014-10-20 MED ORDER — LEVETIRACETAM 500 MG PO TABS
500.0000 mg | ORAL_TABLET | Freq: Two times a day (BID) | ORAL | Status: DC
Start: 2014-10-21 — End: 2014-10-21
  Filled 2014-10-20 (×2): qty 1

## 2014-10-20 MED ORDER — NALOXONE HCL 1 MG/ML IJ SOLN
2.0000 mg | Freq: Once | INTRAMUSCULAR | Status: AC
  Administered 2014-10-20: 2 mg via INTRAVENOUS

## 2014-10-20 MED ORDER — LORAZEPAM 2 MG/ML IJ SOLN: 2.0000 mg | INTRAMUSCULAR | Status: DC | PRN

## 2014-10-20 MED ORDER — SODIUM CHLORIDE 0.9 % IV SOLN
500.0000 mg | Freq: Two times a day (BID) | INTRAVENOUS | Status: AC
  Administered 2014-10-20: 500 mg via INTRAVENOUS
  Filled 2014-10-20 (×2): qty 5

## 2014-10-20 MED ORDER — SODIUM CHLORIDE 0.9 % IV BOLUS (SEPSIS)
1000.0000 mL | Freq: Once | INTRAVENOUS | Status: AC
  Administered 2014-10-20: 1000 mL via INTRAVENOUS

## 2014-10-20 NOTE — ED Notes (Signed)
Dr. Wickline at bedside.  
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
## 2014-10-20 NOTE — Consult Note (Signed)
Admission H&P    Chief Complaint: Rule out seizure activity.  HPI: Bryan Curry is an 24 y.o. male brought to the emergency room after experiencing a generalized seizure, as well as seizure activity in route to the hospital. He was given a total of 10 mg of Versed. Patient had been out raking with some friends. His alcohol level was 209. He had a similar episode of seizure activity on 05/18/2014 which is also associated with alcohol consumption. He has no known medical disorder other than reactive airway disorder. CT scan of his head was unremarkable.  History reviewed. No pertinent past medical history.  History reviewed. No pertinent past surgical history.  Family History  Problem Relation Age of Onset  . Cancer Paternal Grandfather    Social History:  reports that he has never smoked. He does not have any smokeless tobacco history on file. He reports that he drinks alcohol. He reports that he does not use illicit drugs.  Allergies: No Known Allergies  Medications: Albuterol inhaler when necessary wheezing or shortness of breath.  ROS: History obtained from patient's mother and sister.  General ROS: negative for - chills, fatigue, fever, night sweats, weight gain or weight loss Psychological ROS: negative for - behavioral disorder, hallucinations, memory difficulties, mood swings or suicidal ideation Ophthalmic ROS: negative for - blurry vision, double vision, eye pain or loss of vision ENT ROS: negative for - epistaxis, nasal discharge, oral lesions, sore throat, tinnitus or vertigo Allergy and Immunology ROS: negative for - hives or itchy/watery eyes Hematological and Lymphatic ROS: negative for - bleeding problems, bruising or swollen lymph nodes Endocrine ROS: negative for - galactorrhea, hair pattern changes, polydipsia/polyuria or temperature intolerance Respiratory ROS: negative for - cough, hemoptysis, shortness of breath or wheezing Cardiovascular ROS: negative for -  chest pain, dyspnea on exertion, edema or irregular heartbeat Gastrointestinal ROS: negative for - abdominal pain, diarrhea, hematemesis, nausea/vomiting or stool incontinence Genito-Urinary ROS: negative for - dysuria, hematuria, incontinence or urinary frequency/urgency Musculoskeletal ROS: negative for - joint swelling or muscular weakness Neurological ROS: as noted in HPI Dermatological ROS: negative for rash and skin lesion changes  Physical Examination: Blood pressure 89/36, pulse 90, temperature 96 F (35.6 C), temperature source Rectal, resp. rate 16, SpO2 97 %.  HEENT-  Normocephalic, no lesions, without obvious abnormality.  Normal external eye and conjunctiva.  Normal TM's bilaterally.  Normal auditory canals and external ears. Normal external nose, mucus membranes and septum.  Normal pharynx. Neck supple with no masses, nodes, nodules or enlargement. Cardiovascular - regular rate and rhythm, S1, S2 normal, no murmur, click, rub or gallop Lungs - chest clear, no wheezing, rales, normal symmetric air entry Abdomen - soft, non-tender; bowel sounds normal; no masses,  no organomegaly Extremities - no joint deformities, effusion, or inflammation, no edema and no skin discoloration  Neurologic Examination: Patient was sedated and could not be aroused. He reacted minimally to stimuli. Pupils were equal and reacted normally to light. Extraocular movements were sluggish but intact to oculocephalic maneuvers. No facial weakness was noted. Muscle tone was flaccid throughout. There was no abnormal posturing. Patient had no spontaneous movements. Deep tendon reflexes were 2+ and symmetrical. Plantar responses were flexor bilaterally.  Results for orders placed or performed during the hospital encounter of 10/20/14 (from the past 48 hour(s))  CBG monitoring, ED     Status: None   Collection Time: 10/20/14  2:18 AM  Result Value Ref Range   Glucose-Capillary 91 65 - 99 mg/dL  I-Stat Chem  8, ED  (not at [facility])     Status: Abnormal   Collection Time: 10/20/14  2:29 AM  Result Value Ref Range   Sodium 142 135 - 145 mmol/L   Potassium 3.9 3.5 - 5.1 mmol/L   Chloride 106 101 - 111 mmol/L   BUN 14 6 - 20 mg/dL   Creatinine, Ser 1.00 0.61 - 1.24 mg/dL   Glucose, Bld 101 (H) 65 - 99 mg/dL   Calcium, Ion 1.07 (L) 1.12 - 1.23 mmol/L   TCO2 20 0 - 100 mmol/L   Hemoglobin 17.3 (H) 13.0 - 17.0 g/dL   HCT 51.0 39.0 - 52.0 %  CBC     Status: None   Collection Time: 10/20/14  2:33 AM  Result Value Ref Range   WBC 6.9 4.0 - 10.5 K/uL   RBC 5.20 4.22 - 5.81 MIL/uL   Hemoglobin 17.0 13.0 - 17.0 g/dL   HCT 47.9 39.0 - 52.0 %   MCV 92.1 78.0 - 100.0 fL   MCH 32.7 26.0 - 34.0 pg   MCHC 35.5 30.0 - 36.0 g/dL   RDW 12.4 11.5 - 15.5 %   Platelets 186 150 - 400 K/uL  Differential     Status: None   Collection Time: 10/20/14  2:33 AM  Result Value Ref Range   Neutrophils Relative % 55 43 - 77 %   Neutro Abs 3.8 1.7 - 7.7 K/uL   Lymphocytes Relative 35 12 - 46 %   Lymphs Abs 2.4 0.7 - 4.0 K/uL   Monocytes Relative 5 3 - 12 %   Monocytes Absolute 0.4 0.1 - 1.0 K/uL   Eosinophils Relative 5 0 - 5 %   Eosinophils Absolute 0.3 0.0 - 0.7 K/uL   Basophils Relative 0 0 - 1 %   Basophils Absolute 0.0 0.0 - 0.1 K/uL  Comprehensive metabolic panel     Status: Abnormal   Collection Time: 10/20/14  2:33 AM  Result Value Ref Range   Sodium 140 135 - 145 mmol/L   Potassium 3.8 3.5 - 5.1 mmol/L   Chloride 106 101 - 111 mmol/L   CO2 19 (L) 22 - 32 mmol/L   Glucose, Bld 99 65 - 99 mg/dL   BUN 11 6 - 20 mg/dL   Creatinine, Ser 0.77 0.61 - 1.24 mg/dL   Calcium 8.7 (L) 8.9 - 10.3 mg/dL   Total Protein 7.4 6.5 - 8.1 g/dL   Albumin 4.4 3.5 - 5.0 g/dL   AST 24 15 - 41 U/L   ALT 21 17 - 63 U/L   Alkaline Phosphatase 63 38 - 126 U/L   Total Bilirubin 0.4 0.3 - 1.2 mg/dL   GFR calc non Af Amer >60 >60 mL/min   GFR calc Af Amer >60 >60 mL/min    Comment: (NOTE) The eGFR has been calculated  using the CKD EPI equation. This calculation has not been validated in all clinical situations. eGFR's persistently <60 mL/min signify possible Chronic Kidney Disease.    Anion gap 15 5 - 15  Troponin I     Status: None   Collection Time: 10/20/14  2:33 AM  Result Value Ref Range   Troponin I <0.03 <0.031 ng/mL    Comment:        NO INDICATION OF MYOCARDIAL INJURY.   CK     Status: None   Collection Time: 10/20/14  2:33 AM  Result Value Ref Range   Total CK 172 49 - 397  U/L  Ethanol     Status: Abnormal   Collection Time: 10/20/14  2:36 AM  Result Value Ref Range   Alcohol, Ethyl (B) 209 (H) <5 mg/dL    Comment:        LOWEST DETECTABLE LIMIT FOR SERUM ALCOHOL IS 5 mg/dL FOR MEDICAL PURPOSES ONLY   Salicylate level     Status: None   Collection Time: 10/20/14  2:36 AM  Result Value Ref Range   Salicylate Lvl <1.6 2.8 - 30.0 mg/dL  Acetaminophen level     Status: Abnormal   Collection Time: 10/20/14  2:36 AM  Result Value Ref Range   Acetaminophen (Tylenol), Serum <10 (L) 10 - 30 ug/mL    Comment:        THERAPEUTIC CONCENTRATIONS VARY SIGNIFICANTLY. A RANGE OF 10-30 ug/mL MAY BE AN EFFECTIVE CONCENTRATION FOR MANY PATIENTS. HOWEVER, SOME ARE BEST TREATED AT CONCENTRATIONS OUTSIDE THIS RANGE. ACETAMINOPHEN CONCENTRATIONS >150 ug/mL AT 4 HOURS AFTER INGESTION AND >50 ug/mL AT 12 HOURS AFTER INGESTION ARE OFTEN ASSOCIATED WITH TOXIC REACTIONS.   I-Stat arterial blood gas, ED     Status: Abnormal   Collection Time: 10/20/14  2:42 AM  Result Value Ref Range   pH, Arterial 7.265 (L) 7.350 - 7.450   pCO2 arterial 49.1 (H) 35.0 - 45.0 mmHg   pO2, Arterial 135.0 (H) 80.0 - 100.0 mmHg   Bicarbonate 22.7 20.0 - 24.0 mEq/L   TCO2 24 0 - 100 mmol/L   O2 Saturation 99.0 %   Acid-base deficit 5.0 (H) 0.0 - 2.0 mmol/L   Patient temperature 96.0 F    Collection site RADIAL, ALLEN'S TEST ACCEPTABLE    Drawn by Operator    Sample type ARTERIAL    Ct Head Wo  Contrast  10/20/2014   CLINICAL DATA:  Acute onset of seizure activity. Patient unresponsive. Overdose. Initial encounter.  EXAM: CT HEAD WITHOUT CONTRAST  TECHNIQUE: Contiguous axial images were obtained from the base of the skull through the vertex without intravenous contrast.  COMPARISON:  None.  FINDINGS: There is no evidence of acute infarction, mass lesion, or intra- or extra-axial hemorrhage on CT.  The posterior fossa, including the cerebellum, brainstem and fourth ventricle, is within normal limits. The third and lateral ventricles, and basal ganglia are unremarkable in appearance. The cerebral hemispheres are symmetric in appearance, with normal gray-white differentiation. No mass effect or midline shift is seen.  There is no evidence of fracture; visualized osseous structures are unremarkable in appearance. The visualized portions of the orbits are within normal limits. The paranasal sinuses and mastoid air cells are well-aerated. No significant soft tissue abnormalities are seen.  IMPRESSION: Unremarkable noncontrast CT of the head.   Electronically Signed   By: Garald Balding M.D.   On: 10/20/2014 02:57   Dg Chest Portable 1 View  10/20/2014   CLINICAL DATA:  Acute onset of confusion and seizure like activity. Initial encounter.  EXAM: PORTABLE CHEST - 1 VIEW  COMPARISON:  Chest radiograph and CTA of the chest performed 07/22/2012  FINDINGS: The lungs are well-aerated and clear. There is no evidence of focal opacification, pleural effusion or pneumothorax.  The cardiomediastinal silhouette is borderline normal in size. No acute osseous abnormalities are seen.  IMPRESSION: No acute cardiopulmonary process seen.   Electronically Signed   By: Garald Balding M.D.   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 Whether seizures are provoked or are manifestations  of primary seizure disorder is unclear.  Examination was unremarkable, as well as CT scan of his head.  Recommendations: 1. Keppra 1000 mg IV loading dose followed by 500 mg every 12 hours, for now. 2. MRI of the brain without and with contrast media 3. EEG routine about study  We will continue to follow this patient with you.  C.R. Nicole Kindred, MD [facility] [phone]  10/20/2014, 5:23 AM

## 2014-10-20 NOTE — Progress Notes (Signed)
[place] TEAM 1 - Stepdown/ICU TEAM PROGRESS NOTE  MASYN ROSTRO ZOX:[id number] DOB: 1990/09/11 DOA: 10/20/2014 PCP: PROVIDER NOT IN SYSTEM  Admit HPI / Brief Narrative: 24 year old male with history of a tonic clonic seizure in March 2016 who was brought to the ED by EMS for a similar presentation. Patient drank 2-6 cans of beer (unclear) then suddenly started having tonic clonic seizure activity.  No evidence of trauma, or urinary/bowl incontinence. Family said he was in his usual state of health and had no complaints at all beforehand. They believe his seizure was precipitated by beer and hot wings which is similar to what happened in March. His BP was 80s when checked by EMS but he responded to fluid resuscitation.   HPI/Subjective: Pt seen for f/u visit.    Assessment/Plan:  Seizure First episode March 2016 - this marked episode #2 - both episodes in setting of alcohol consumption - Keppra started per neurology  Mixed respiratory/metabolic acidosis Due to seizure activity  Hypotension Felt to be due to benzodiazepine  Code Status: FULL Family Communication: no family present at time of exam Disposition Plan:   Consultants: Neurology   Procedures: none  Antibiotics: none  DVT prophylaxis: lovenox   Objective: Blood pressure 95/68, pulse 88, temperature 97.7 F (36.5 C), temperature source Axillary, resp. rate 17, height 5\' 9"  (1.753 m), weight 88.5 kg (195 lb 1.7 oz), SpO2 100 %.  Intake/Output Summary (Last 24 hours) at 10/20/14 1531 Last data filed at 10/20/14 1100  Gross per 24 hour  Intake   2450 ml  Output      0 ml  Net   2450 ml   Exam: Pt seen for f/u visit.    Data Reviewed: Basic Metabolic Panel:  Recent Labs Lab 10/20/14 0229 10/20/14 0233  NA 142 140  K 3.9 3.8  CL 106 106  CO2  --  19*  GLUCOSE 101* 99  BUN 14 11  CREATININE 1.00 0.77  CALCIUM  --  8.7*    CBC:  Recent Labs Lab 10/20/14 0229 10/20/14 0233  WBC  --  6.9    NEUTROABS  --  3.8  HGB 17.3* 17.0  HCT 51.0 47.9  MCV  --  92.1  PLT  --  186   Liver Function Tests:  Recent Labs Lab 10/20/14 0233  AST 24  ALT 21  ALKPHOS 63  BILITOT 0.4  PROT 7.4  ALBUMIN 4.4   Cardiac Enzymes:  Recent Labs Lab 10/20/14 0233  CKTOTAL 172  TROPONINI <0.03    CBG:  Recent Labs Lab 10/20/14 0218  GLUCAP 91    Recent Results (from the past 240 hour(s))  MRSA PCR Screening     Status: None   Collection Time: 10/20/14  8:00 AM  Result Value Ref Range Status   MRSA by PCR NEGATIVE NEGATIVE Final    Comment:        The GeneXpert MRSA Assay (FDA approved for NASAL specimens only), is one component of a comprehensive MRSA colonization surveillance program. It is not intended to diagnose MRSA infection nor to guide or monitor treatment for MRSA infections.      Studies:   Recent x-ray studies have been reviewed in detail by the Attending Physician  Scheduled Meds:  Scheduled Meds: . enoxaparin (LOVENOX) injection  40 mg Subcutaneous Q24H  . levETIRAcetam  500 mg Intravenous Q12H    Time spent on care of this patient: no charge    MCCLUNG,JEFFREY T ,  MD   [facility] Office  [phone] Pager - Text Page per Loretha Stapler as per below:  On-Call/Text Page:      [email or web address]      password [email or web address]  If 7PM-7AM, please contact night-coverage [email or web address] Password [email or web address] 10/20/2014, 3:31 PM   LOS: 0 days

## 2014-10-20 NOTE — Progress Notes (Signed)
Pt refusing Keppra explained to patient the rationale for keppra and the potential of not taking medications family were also present and are also suspicious of medicines.  Pt also explains he has to leave tomorrow this nurse explained he will have to cover that with MD

## 2014-10-20 NOTE — Procedures (Signed)
ELECTROENCEPHALOGRAM REPORT  Patient: RODERT HINCH       Room #: 1B14 EEG No. ID: [id number] Age: 24 y.o.        Sex: male Referring Physician: Maretta Bees Report Date:  10/20/2014        Interpreting Physician: Aline Brochure  History: AD GUTTMAN is an 24 y.o. male admitted following generalized seizure activity associated with alcohol consumption. He had a seizure in April 2016 as well associated with alcohol consumption. Alcohol level in the ED was 209. CT scan of his head was normal.  Indications for study:  Rule out seizure disorder.  Technique: This is an 18 channel routine scalp EEG performed at the bedside with bipolar and monopolar montages arranged in accordance to the international 10/20 system of electrode placement.   Description: This EEG recording was performed during sleep. Background activity consisted of low amplitude diffuse next irregular delta and theta activity with superimposed mixed low amplitude diffuse beta activity was most prominent in the frontal and central regions. Photic stimulation was not performed. Symmetrical sleep spindles, vertex waves and K-complexes are recorded during stage II of sleep. No epileptiform discharges were recorded. There was no abnormal slowing of cerebral activity.  Interpretation: This is a normal EEG recording during sleep. No evidence of an epileptic disorder demonstrated. The absence of epileptiform discharges and an EEG recording does not necessarily rule out seizure disorder, however.   Venetia Maxon M.D. [facility] [phone]

## 2014-10-20 NOTE — ED Notes (Signed)
Attempted report 

## 2014-10-20 NOTE — ED Notes (Addendum)
Pt was at a bar tonight with his cousin. Cousin reported pt began having seizure like activity while walking to the car. Pt was assisted to the ground, had one episode of vomiting, and continued having seizure.   Mother at bedside. Per family, pt had a "seizure in April.Marland Kitchenlasting about ." family also reporting, pt has been c/o of "stomach issues" at home. Pt still unresponsive, maintaining airway.

## 2014-10-20 NOTE — Progress Notes (Signed)
EEG completed; results pending.    

## 2014-10-20 NOTE — ED Provider Notes (Signed)
CSN: [id number]     Arrival date & time 10/20/14  0218 History  This chart was scribed for Bryan Rhine, MD by Leone Payor, ED Scribe. This patient was seen in room [facility] and the patient's care was started 2:17 AM.    Chief Complaint  Patient presents with  . Altered Mental Status  . Alcohol Intoxication   The history is provided by the EMS personnel. The history is limited by the condition of the patient. No language interpreter was used.    LEVEL 5 CAVEAT: ALTERED MENTAL STATUS HPI Comments: Bryan Curry is a 24 y.o. male who presents to the Emergency Department complaining of altered mental status that began PTA. EMS reports patient was at a bar and was found on the ground with shaking and "flopping". He was eased to the ground by cousins and did not have any known trauma. He was given Versed 10 mg en route for seizure like activity.   Family reports patient had a seizure in April but was not treated for this.    PMH - unknown Soc hx - unknown Family History  Problem Relation Age of Onset  . Cancer Paternal Grandfather    Social History  Substance Use Topics  . Smoking status: Never Smoker   . Smokeless tobacco: Not on file  . Alcohol Use: Yes    Review of Systems  Unable to perform ROS: Mental status change    Allergies  Review of patient's allergies indicates no known allergies.  Home Medications   Prior to Admission medications   Medication Sig Start Date End Date Taking? Authorizing Provider  albuterol (PROVENTIL HFA;VENTOLIN HFA) 108 (90 BASE) MCG/ACT inhaler Inhale 1-2 puffs into the lungs every 6 (six) hours as needed for wheezing or shortness of breath.    Historical Provider, MD  erythromycin ophthalmic ointment Place a 1/2 inch ribbon of ointment into both lower eyelid twice daily for the next 7 days. 10/22/13   Fayrene Helper, PA-C  HYDROcodone-acetaminophen (NORCO/VICODIN) 5-325 MG per tablet Take 1 tablet by mouth every 6 (six) hours as needed for  moderate pain or severe pain. 10/22/13   Fayrene Helper, PA-C   BP 90/58 mmHg  Pulse 98  Temp(Src) 96.9 F (36.1 C) (Axillary)  Resp 20  SpO2 98% Physical Exam  Nursing note and vitals reviewed.  CONSTITUTIONAL: Well developed/well nourished. Patient unresponsive  HEAD: Normocephalic/atraumatic, NO SIGNS OF TRAUMA EYES: pupils pinpoint bilaterally ENMT: Mucous membranes moist NECK: supple no meningeal signs SPINE/BACK:No bruising/crepitance/stepoffs noted to spine CV: S1/S2 noted, no murmurs/rubs/gallops noted LUNGS: Lungs are clear to auscultation bilaterally, no apparent distress ABDOMEN: soft, no bruising noted NEURO: Pt is unresponsive, will intermittently move his arms at random.  EXTREMITIES: pulses normal/equal, full ROM SKIN: warm, color normal PSYCH: unable to assess  ED Course  Procedures  CRITICAL CARE Performed by: Joya Gaskins Total critical care time: 33 Critical care time was exclusive of separately billable procedures and treating other patients. Critical care was necessary to treat or prevent imminent or life-threatening deterioration. Critical care was time spent personally by me on the following activities: development of treatment plan with patient and/or surrogate as well as nursing, discussions with consultants, evaluation of patient's response to treatment, examination of patient, obtaining history from patient or surrogate, ordering and performing treatments and interventions, ordering and review of laboratory studies, ordering and review of radiographic studies, pulse oximetry and re-evaluation of patient's condition. PATIENT WITH EPISODES OF HYPOTENSION THAT REQUIRED IV FLUIDS IN THE ER NEURO CONSULT  AND HOSPITALIST CONSULT IN ED  DIAGNOSTIC STUDIES: Oxygen Saturation is 98% on RA, normal by my interpretation.    COORDINATION OF CARE: 2:25 AM Will order labs, CXR, and head CT.   pt from bar with altered mental status and SZ like activity He is  sleeping at this time due to  versed Workup initiated He is protecting his airway and not hypoxic at this time Will continue to monitor 3:28 AM Pt sleeping No hypoxa He has appropriate resp effort Initial imaging negative Family reports he may have had previous SZ activity last spring Will consult neuro 3:38 AM D/w dr Roseanne Reno Recommend starting keppra 1gm Admit to hospitalist 5:17 AM Pt with episodes of hypotension that resolve with IV fluids Pt is sleeping but is starting to respond to voice  ABG improving No hypoxia No lactic acidosis D/w dr Mickle Mallory Will admit to stepdown BP 89/49 mmHg  Pulse 80  Temp(Src) 96 F (35.6 C) (Rectal)  Resp 15  SpO2 99%  Labs Review Labs Reviewed  ETHANOL - Abnormal; Notable for the following:    Alcohol, Ethyl (B) 209 (*)    All other components within normal limits  COMPREHENSIVE METABOLIC PANEL - Abnormal; Notable for the following:    CO2 19 (*)    Calcium 8.7 (*)    All other components within normal limits  ACETAMINOPHEN LEVEL - Abnormal; Notable for the following:    Acetaminophen (Tylenol), Serum <10 (*)    All other components within normal limits  I-STAT CHEM 8, ED - Abnormal; Notable for the following:    Glucose, Bld 101 (*)    Calcium, Ion 1.07 (*)    Hemoglobin 17.3 (*)    All other components within normal limits  I-STAT ARTERIAL BLOOD GAS, ED - Abnormal; Notable for the following:    pH, Arterial 7.265 (*)    pCO2 arterial 49.1 (*)    pO2, Arterial 135.0 (*)    Acid-base deficit 5.0 (*)    All other components within normal limits  I-STAT ARTERIAL BLOOD GAS, ED - Abnormal; Notable for the following:    pH, Arterial 7.254 (*)    Bicarbonate 19.7 (*)    Acid-base deficit 8.0 (*)    All other components within normal limits  CBC  DIFFERENTIAL  TROPONIN I  CK  SALICYLATE LEVEL  URINE RAPID DRUG SCREEN, HOSP PERFORMED  URINALYSIS, ROUTINE W REFLEX MICROSCOPIC (NOT AT [facility])  CBG MONITORING, ED  I-STAT CG4  LACTIC ACID, ED    Imaging Review Ct Head Wo Contrast  10/20/2014   CLINICAL DATA:  Acute onset of seizure activity. Patient unresponsive. Overdose. Initial encounter.  EXAM: CT HEAD WITHOUT CONTRAST  TECHNIQUE: Contiguous axial images were obtained from the base of the skull through the vertex without intravenous contrast.  COMPARISON:  None.  FINDINGS: There is no evidence of acute infarction, mass lesion, or intra- or extra-axial hemorrhage on CT.  The posterior fossa, including the cerebellum, brainstem and fourth ventricle, is within normal limits. The third and lateral ventricles, and basal ganglia are unremarkable in appearance. The cerebral hemispheres are symmetric in appearance, with normal gray-white differentiation. No mass effect or midline shift is seen.  There is no evidence of fracture; visualized osseous structures are unremarkable in appearance. The visualized portions of the orbits are within normal limits. The paranasal sinuses and mastoid air cells are well-aerated. No significant soft tissue abnormalities are seen.  IMPRESSION: Unremarkable noncontrast CT of the head.   Electronically Signed   By:  [name] M.D.   On: 10/20/2014 02:57   Dg Chest Portable 1 View  10/20/2014   CLINICAL DATA:  Acute onset of confusion and seizure like activity. Initial encounter.  EXAM: PORTABLE CHEST - 1 VIEW  COMPARISON:  Chest radiograph and CTA of the chest performed 07/22/2012  FINDINGS: The lungs are well-aerated and clear. There is no evidence of focal opacification, pleural effusion or pneumothorax.  The cardiomediastinal silhouette is borderline normal in size. No acute osseous abnormalities are seen.  IMPRESSION: No acute cardiopulmonary process seen.   Electronically Signed   By: [name] M.D.   On: 10/20/2014 02:56   I have personally reviewed and evaluated these images and lab results as part of my medical decision-making.   EKG Interpretation   Date/Time:  Saturday October 20 2014 02:11:17 EDT Ventricular Rate:  96 PR Interval:  178 QRS Duration: 92 QT Interval:  365 QTC Calculation: 461 R Axis:   89 Text Interpretation:  Sinus rhythm Consider left atrial enlargement ST  elev, probable normal early repol pattern No previous ECGs available  Confirmed by Bebe Shaggy  MD, Sander Speckman ([id number]) on 10/20/2014 2:32:15 AM     Medications  sodium chloride 0.9 % bolus 1,000 mL (0 mLs Intravenous Stopped 10/20/14 0301)  naloxone (NARCAN) injection 2 mg (2 mg Intravenous Given 10/20/14 0225)  sodium chloride 0.9 % bolus 1,000 mL (0 mLs Intravenous Stopped 10/20/14 0402)  levETIRAcetam (KEPPRA) 1,000 mg in sodium chloride 0.9 % 100 mL IVPB (0 mg Intravenous Stopped 10/20/14 0433)  sodium chloride 0.9 % bolus 1,000 mL (0 mLs Intravenous Stopped 10/20/14 0507)  sodium chloride 0.9 % bolus 1,000 mL (0 mLs Intravenous Stopped 10/20/14 0551)    MDM   Final diagnoses:  Seizure-like activity  Alcohol abuse  Altered mental status, unspecified altered mental status type    Nursing notes including past medical history and social history reviewed and considered in documentation xrays/imaging reviewed by myself and considered during evaluation Labs/vital reviewed myself and considered during evaluation   I personally performed the services described in this documentation, which was scribed in my presence. The recorded information has been reviewed and is accurate.      Bryan Rhine, MD 10/20/14 [phone]

## 2014-10-20 NOTE — ED Notes (Signed)
CBG 91 

## 2014-10-21 MED ORDER — LEVETIRACETAM 500 MG PO TABS: 500.0000 mg | ORAL_TABLET | Freq: Two times a day (BID) | ORAL | Status: DC

## 2014-10-21 NOTE — Progress Notes (Signed)
Discharge instructions given to patient and patients mother all questions answered at this time.  Prescription for Keppra given to patient as well as education on importance of taking his anti seizure medication, pt. Verbalized understanding.  Pt. VSS with no s/s of distress noted.  Patient stable at discharge.

## 2014-10-21 NOTE — Discharge Instructions (Signed)
You can not drive for 6 months.  According to [facility] law you must be seizure free for at least 6 months before resuming driving. No swimming alone, operating heavy machinery, no climbing, no risky tasks as you could die if you were to have a seizure during these activities.  SEIZURE  Epilepsy is a disorder in which a person has repeated seizures over time. A seizure is a release of abnormal electrical activity in the brain. Seizures can cause a change in attention, behavior, or the ability to remain awake and alert (altered mental status). Seizures often involve uncontrollable shaking (convulsions).  Most people with epilepsy lead normal lives. However, people with epilepsy are at an increased risk of falls, accidents, and injuries. Therefore, it is important to begin treatment right away. CAUSES  Epilepsy has many possible causes. Anything that disturbs the normal pattern of brain cell activity can lead to seizures. This may include:   Head injury.  Birth trauma.  High fever as a child.  Stroke.  Bleeding into or around the brain.  Certain drugs.  Prolonged low oxygen, such as what occurs after CPR efforts.  Abnormal brain development.  Certain illnesses, such as meningitis, encephalitis (brain infection), malaria, and other infections.  An imbalance of nerve signaling chemicals (neurotransmitters).  SIGNS AND SYMPTOMS  The symptoms of a seizure can vary greatly from one person to another. Right before a seizure, you may have a warning (aura) that a seizure is about to occur. An aura may include the following symptoms:  Fear or anxiety.  Nausea.  Feeling like the room is spinning (vertigo).  Vision changes, such as seeing flashing lights or spots. Common symptoms during a seizure include:  Abnormal sensations, such as an abnormal smell or a bitter taste in the mouth.   Sudden, general body stiffness.   Convulsions that involve rhythmic jerking of the face, arm, or leg on  one or both sides.   Sudden change in consciousness.   Appearing to be awake but not responding.   Appearing to be asleep but cannot be awakened.   Grimacing, chewing, lip smacking, drooling, tongue biting, or loss of bowel or bladder control. After a seizure, you may feel sleepy for a while. DIAGNOSIS  Your health care provider will ask about your symptoms and take a medical history. Descriptions from any witnesses to your seizures will be very helpful in the diagnosis. A physical exam, including a detailed neurological exam, is necessary. Various tests may be done, such as:   An electroencephalogram (EEG). This is a painless test of your brain waves. In this test, a diagram is created of your brain waves. These diagrams can be interpreted by a specialist.  An MRI of the brain.   A CT scan of the brain.   A spinal tap (lumbar puncture, LP).  Blood tests to check for signs of infection or abnormal blood chemistry. TREATMENT  There is no cure for epilepsy, but it is generally treatable. Once epilepsy is diagnosed, it is important to begin treatment as soon as possible. For most people with epilepsy, seizures can be controlled with medicines. The following may also be used:  A pacemaker for the brain (vagus nerve stimulator) can be used for people with seizures that are not well controlled by medicine.  Surgery on the brain. For some people, epilepsy eventually goes away. HOME CARE INSTRUCTIONS   Follow your health care provider's recommendations on driving and safety in normal activities.  Get enough rest. Lack of  sleep can cause seizures.  Only take over-the-counter or prescription medicines as directed by your health care provider. Take any prescribed medicine exactly as directed.  Avoid any known triggers of your seizures.  Keep a seizure diary. Record what you recall about any seizure, especially any possible trigger.   Make sure the people you live and work with  know that you are prone to seizures. They should receive instructions on how to help you. In general, a witness to a seizure should:   Cushion your head and body.   Turn you on your side.   Avoid unnecessarily restraining you.   Not place anything inside your mouth.   Call for emergency medical help if there is any question about what has occurred.   Follow up with your health care provider as directed. You may need regular blood tests to monitor the levels of your medicine.  SEEK MEDICAL CARE IF:   You develop signs of infection or other illness. This might increase the risk of a seizure.   You seem to be having more frequent seizures.   Your seizure pattern is changing.  SEEK IMMEDIATE MEDICAL CARE IF:   You have a seizure that does not stop after a few moments.   You have a seizure that causes any difficulty in breathing.   You have a seizure that results in a very severe headache.   You have a seizure that leaves you with the inability to speak or use a part of your body.  Document Released: 02/02/2005 Document Revised: 11/23/2012 Document Reviewed: 09/14/2012 [facility] Patient Information 2015 [place], [place]. This information is not intended to replace advice given to you by your health care provider. Make sure you discuss any questions you have with your health care provider.   Alcohol Use Disorder  Alcohol use disorder is a mental disorder. It is not a one-time incident of heavy drinking. Alcohol use disorder is the excessive and uncontrollable use of alcohol over time that leads to problems with functioning in one or more areas of daily living. People with this disorder risk harming themselves and others when they drink to excess. Alcohol use disorder also can cause other mental disorders, such as mood and anxiety disorders, and serious physical problems. People with alcohol use disorder often misuse other drugs.  Alcohol use disorder is common and  widespread. Some people with this disorder drink alcohol to cope with or escape from negative life events. Others drink to relieve chronic pain or symptoms of mental illness. People with a family history of alcohol use disorder are at higher risk of losing control and using alcohol to excess.  SYMPTOMS  Signs and symptoms of alcohol use disorder may include the following:   Consumption ofalcohol inlarger amounts or over a longer period of time than intended.  Multiple unsuccessful attempts to cutdown or control alcohol use.   A great deal of time spent obtaining alcohol, using alcohol, or recovering from the effects of alcohol (hangover).  A strong desire or urge to use alcohol (cravings).   Continued use of alcohol despite problems at work, school, or home because of alcohol use.   Continued use of alcohol despite problems in relationships because of alcohol use.  Continued use of alcohol in situations when it is physically hazardous, such as driving a car.  Continued use of alcohol despite awareness of a physical or psychological problem that is likely related to alcohol use. Physical problems related to alcohol use can involve the brain, heart,  liver, stomach, and intestines. Psychological problems related to alcohol use include intoxication, depression, anxiety, psychosis, delirium, and dementia.   The need for increased amounts of alcohol to achieve the same desired effect, or a decreased effect from the consumption of the same amount of alcohol (tolerance).  Withdrawal symptoms upon reducing or stopping alcohol use, or alcohol use to reduce or avoid withdrawal symptoms. Withdrawal symptoms include:  Racing heart.  Hand tremor.  Difficulty sleeping.  Nausea.  Vomiting.  Hallucinations.  Restlessness.  Seizures. DIAGNOSIS Alcohol use disorder is diagnosed through an assessment by your health care provider. Your health care provider may start by asking three or four  questions to screen for excessive or problematic alcohol use. To confirm a diagnosis of alcohol use disorder, at least two symptoms must be present within a 46-month period. The severity of alcohol use disorder depends on the number of symptoms:  Mild--two or three.  Moderate--four or five.  Severe--six or more. Your health care provider may perform a physical exam or use results from lab tests to see if you have physical problems resulting from alcohol use. Your health care provider may refer you to a mental health professional for evaluation. TREATMENT  Some people with alcohol use disorder are able to reduce their alcohol use to low-risk levels. Some people with alcohol use disorder need to quit drinking alcohol. When necessary, mental health professionals with specialized training in substance use treatment can help. Your health care provider can help you decide how severe your alcohol use disorder is and what type of treatment you need. The following forms of treatment are available:   Detoxification. Detoxification involves the use of prescription medicines to prevent alcohol withdrawal symptoms in the first week after quitting. This is important for people with a history of symptoms of withdrawal and for heavy drinkers who are likely to have withdrawal symptoms. Alcohol withdrawal can be dangerous and, in severe cases, cause death. Detoxification is usually provided in a hospital or in-patient substance use treatment facility.  Counseling or talk therapy. Talk therapy is provided by substance use treatment counselors. It addresses the reasons people use alcohol and ways to keep them from drinking again. The goals of talk therapy are to help people with alcohol use disorder find healthy activities and ways to cope with life stress, to identify and avoid triggers for alcohol use, and to handle cravings, which can cause relapse.  Medicines.Different medicines can help treat alcohol use disorder  through the following actions:  Decrease alcohol cravings.  Decrease the positive reward response felt from alcohol use.  Produce an uncomfortable physical reaction when alcohol is used (aversion therapy).  Support groups. Support groups are run by people who have quit drinking. They provide emotional support, advice, and guidance. These forms of treatment are often combined. Some people with alcohol use disorder benefit from intensive combination treatment provided by specialized substance use treatment centers. Both inpatient and outpatient treatment programs are available. Document Released: 03/12/2004 Document Revised: 06/19/2013 Document Reviewed: 05/12/2012 [facility] Patient Information 2015 [employer], [place]. This information is not intended to replace advice given to you by your health care provider. Make sure you discuss any questions you have with your health care provider.

## 2014-10-21 NOTE — Discharge Summary (Addendum)
DISCHARGE SUMMARY  DRAPER GALLON  MR#: [id number]  DOB:1991-01-12  Date of Admission: 10/20/2014 Date of Discharge: 10/21/2014  Attending Physician:Ahmaya Ostermiller T  Patient's QIO:[id number] NOT IN SYSTEM  Consults:  Neurology   Disposition: D/C home   Follow-up Appts:     Follow-up Information    Follow up with [facility]. Schedule an appointment as soon as possible for a visit in 2 weeks.   Contact information:   [address] [place] [address] [phone]     Tests Needing Follow-up: -pursue epilepsy protocol MRI as outpatient to better define subependimal heterotopias -determine when it is clinically safe to lift his driving restriction -determine when or if it is safe to discontinue use of Keppra   Discharge Diagnoses: Seizure Small subependimal heterotopias Mixed respiratory/metabolic acidosis Hypotension EtOH abuse   Initial presentation: 24 year old male with history of a tonic clonic seizure in March 2016 who was brought to the ED by EMS for a similar presentation. Patient drank 2-6 cans of beer (unclear) then suddenly started having tonic clonic seizure activity. No evidence of trauma, or urinary/bowl incontinence. Family said he was in his usual state of health and had no complaints at all beforehand. They believe his seizure was precipitated by beer and hot wings which is similar to what happened in March. His BP was 80s when checked by EMS but he responded to fluid resuscitation.   Hospital Course:  Seizure First episode March 2016 - this marked episode #2 - both episodes in setting of alcohol consumption - Keppra started per Neurology - EEG normal - MRI brain noted subtle nodularity along the lateral atria of the lateral ventricles with small (2-3 mm) foci of decreased T1 signal which is isointense to cortex with a an appearance suggestive of tiny subependymal gray matter heterotopias plan per Neuro is to  pursue epilepsy protocol MRI as outpatient to better define this finding - Neurology recommendations otherwise were as follows:  "strongly recommended that patient continues taking keppra until we have a definite answer to the cause of his paroxysmal events. Further, I advised him to seek professional advise with regards to his alcohol consumption, and stressed the fact that according to [place] law he must be seizure free for at least 6 months before resuming driving. No swimming alone, operating heavy machinery, no climbing, no risky tasks recommended."  At the time of visit prior to his discharge the patient's mother is at the bedside.  She is quite agitated having learned that the patient has been advised to not drive or operate machinery for 6 months.  Per the nursing staff family members have threatened to contact attorneys and accused the medical team of "treating him for something he doesn't have."  I have spent a great deal of time in discussion with the patient and his mother.  I have explained our concern that trained medical personnel (EMS) observed the patient on the scene of his initial event and felt strongly they were observing seizure activity (was dosed with Versed per EMS for seizure activity).  I have explained that there is no "perfect test" that can make it clear whether what he did have was a seizure or not, and that in the absence of a definitive diagnostic tool we must err on the side of caution and proceed in a way that provides the greatest chance of assuring for his safety.  While I sympathized with them that not being able to drive for 6 months is  a very significant inconvenience, I also impressed upon them that his failure to heed this advice could lead to him having a seizure while driving, losing control of his vehicle, and ultimately killing himself or other innocent bystanders.  I have reiterated that our official medical recommendation is that he not drive for 6 months or until  cleared by a neurologist to do so.  I have also encouraged him to continue the Keppra as suggested by the neurologist.  I have advised him that uncontrolled seizure activity is a very serious medical illness and can lead to his untimely death.  I have advised the patient that follow-up with one of the 2 neurology groups in town will be necessary to complete his evaluation.  His mother informs me "we won't be seeing anyone connected with this hospital in any way ever again."  That being said I have advised the patient that he should obtain the Neurologist of his choice to complete this needed follow-up.  Mixed respiratory / metabolic acidosis Due to seizure activity and obtundation - resolved   Hypotension Felt to be due to benzodiazepine  EtOH abuse  Unclear if pt is an alcoholic or not, but this represents a second apparent binge drinking episode that has lead to hospitalization - pt is strongly advised to avoid EtOH entirely     Medication List    TAKE these medications        albuterol 108 (90 BASE) MCG/ACT inhaler  Commonly known as:  PROVENTIL HFA;VENTOLIN HFA  Inhale 1-2 puffs into the lungs every 6 (six) hours as needed for wheezing or shortness of breath.     levETIRAcetam 500 MG tablet  Commonly known as:  KEPPRA  Take 1 tablet (500 mg total) by mouth 2 (two) times daily.       Day of Discharge BP 129/78 mmHg  Pulse 78  Temp(Src) 97.6 F (36.4 C) (Oral)  Resp 16  Ht 5\' 9"  (1.753 m)  Wt 88.5 kg (195 lb 1.7 oz)  BMI 28.80 kg/m2  SpO2 99%  Physical Exam: General: No acute respiratory distress Lungs: Clear to auscultation bilaterally without wheezes or crackles Cardiovascular: Regular rate and rhythm without murmur gallop or rub normal S1 and S2 Abdomen: Nontender, nondistended, soft, bowel sounds positive, no rebound, no ascites, no appreciable mass Extremities: No significant cyanosis, clubbing, or edema bilateral lower extremities  Basic Metabolic  Panel:  Recent Labs Lab 10/20/14 0229 10/20/14 0233  NA 142 140  K 3.9 3.8  CL 106 106  CO2  --  19*  GLUCOSE 101* 99  BUN 14 11  CREATININE 1.00 0.77  CALCIUM  --  8.7*    Liver Function Tests:  Recent Labs Lab 10/20/14 0233  AST 24  ALT 21  ALKPHOS 63  BILITOT 0.4  PROT 7.4  ALBUMIN 4.4   CBC:  Recent Labs Lab 10/20/14 0229 10/20/14 0233  WBC  --  6.9  NEUTROABS  --  3.8  HGB 17.3* 17.0  HCT 51.0 47.9  MCV  --  92.1  PLT  --  186   Cardiac Enzymes:  Recent Labs Lab 10/20/14 0233  CKTOTAL 172  TROPONINI <0.03   CBG:  Recent Labs Lab 10/20/14 0218  GLUCAP 91    Recent Results (from the past 240 hour(s))  MRSA PCR Screening     Status: None   Collection Time: 10/20/14  8:00 AM  Result Value Ref Range Status   MRSA by PCR NEGATIVE NEGATIVE Final  Comment:        The GeneXpert MRSA Assay (FDA approved for NASAL specimens only), is one component of a comprehensive MRSA colonization surveillance program. It is not intended to diagnose MRSA infection nor to guide or monitor treatment for MRSA infections.     Time spent in discharge (includes decision making & examination of pt): 30 minutes  10/21/2014, 8:41 AM   Lonia Blood, MD [facility] Office  [phone] Pager [phone]  On-Call/Text Page:      [email or web address]      password [facility]

## 2014-10-21 NOTE — Progress Notes (Signed)
NEURO HOSPITALIST PROGRESS NOTE   SUBJECTIVE:                                                                                                                        No further seizures noted. Bryan Curry is at the bedside. Tolerating keppra well. Routine EEG normal. MRI brain with and without contrast subtle nodularity along the lateral atria of the lateral ventricles with small (2-3 mm) foci of decreased T1 signal which is isointense to cortex with a an appearance suggestive of tiny subependymal gray matter heterotopias.  OBJECTIVE:                                                                                                                           Vital signs in last 24 hours: Temp:  [97.4 F (36.3 C)-98.1 F (36.7 C)] 98 F (36.7 C) (09/04 0402) Pulse Rate:  [80-104] 84 (09/04 0402) Resp:  [15-24] 16 (09/04 0402) BP: (95-149)/(51-80) 111/73 mmHg (09/04 0402) SpO2:  [95 %-100 %] 99 % (09/04 0402)  Intake/Output from previous day: 09/03 0701 - 09/04 0700 In: 690 [P.O.:240; I.V.:450] Out: -  Intake/Output this shift:   Nutritional status: Diet regular Room service appropriate?: Yes; Fluid consistency:: Thin  History reviewed. No pertinent past medical history.   Physical exam:  Constitutional: well developed, pleasant male in no apparent distress. Eyes: no jaundice or exophthalmos.  Head: normocephalic. Neck: supple, no bruits, no JVD. Cardiac: no murmurs. Lungs: clear. Abdomen: soft, no tender, no mass. Extremities: no edema, clubbing, or cyanosis.  Skin: no rash   Neurologic Exam:  General: Mental Status: Alert, oriented, thought content appropriate.  Speech fluent without evidence of aphasia.  Able to follow 3 step commands without difficulty. Cranial Nerves: II: Discs flat bilaterally; Visual fields grossly normal, pupils equal, round, reactive to light and accommodation III,IV, VI: ptosis not present, extra-ocular motions  intact bilaterally V,VII: smile symmetric, facial light touch sensation normal bilaterally VIII: hearing normal bilaterally IX,X: uvula rises symmetrically XI: bilateral shoulder shrug XII: midline tongue extension without atrophy or fasciculations  Motor: Right : Upper extremity   5/5    Left:     Upper extremity   5/5  Lower extremity   5/5  Lower extremity   5/5 Tone and bulk:normal tone throughout; no atrophy noted Sensory: Pinprick and light touch intact throughout, bilaterally Deep Tendon Reflexes:  Right: Upper Extremity   Left: Upper extremity   biceps (C-5 to C-6) 2/4   biceps (C-5 to C-6) 2/4 tricep (C7) 2/4    triceps (C7) 2/4 Brachioradialis (C6) 2/4  Brachioradialis (C6) 2/4  Lower Extremity Lower Extremity  quadriceps (L-2 to L-4) 2/4   quadriceps (L-2 to L-4) 2/4 Achilles (S1) 2/4   Achilles (S1) 2/4  Plantars: Right: downgoing   Left: downgoing Cerebellar: normal finger-to-nose,  normal heel-to-shin test Gait:  No tested due to multiple leads, safety concerns.    Lab Results: No results found for: CHOL Lipid Panel No results for input(s): CHOL, TRIG, HDL, CHOLHDL, VLDL, LDLCALC in the last 72 hours.  Studies/Results: Ct Head Wo Contrast  10/20/2014   CLINICAL DATA:  Acute onset of seizure activity. Patient unresponsive. Overdose. Initial encounter.  EXAM: CT HEAD WITHOUT CONTRAST  TECHNIQUE: Contiguous axial images were obtained from the base of the skull through the vertex without intravenous contrast.  COMPARISON:  None.  FINDINGS: There is no evidence of acute infarction, mass lesion, or intra- or extra-axial hemorrhage on CT.  The posterior fossa, including the cerebellum, brainstem and fourth ventricle, is within normal limits. The third and lateral ventricles, and basal ganglia are unremarkable in appearance. The cerebral hemispheres are symmetric in appearance, with normal gray-white differentiation. No mass effect or midline shift is seen.  There is no  evidence of fracture; visualized osseous structures are unremarkable in appearance. The visualized portions of the orbits are within normal limits. The paranasal sinuses and mastoid air cells are well-aerated. No significant soft tissue abnormalities are seen.  IMPRESSION: Unremarkable noncontrast CT of the head.   Electronically Signed   By: Bryan Curry M.D.   On: 10/20/2014 02:57   Mr Bryan Curry ZO Contrast  10/20/2014   ADDENDUM REPORT: 10/20/2014 11:36  ADDENDUM: On further review, there is subtle nodularity along the lateral atria of the lateral ventricles with small (2-3 mm) foci of decreased T1 signal which is isointense to cortex. See series 10, image 59 and sagittal image 9 of series 3. The appearance is suggestive of tiny subependymal gray matter heterotopias.  Appearance of the brain otherwise normal.  This addendum will be called to the ordering physician or their representative at this time.   Electronically Signed   By: Bryan Curry M.D.   On: 10/20/2014 11:36   10/20/2014   CLINICAL DATA:  24 year old male with new onset seizure activity this year, possibly triggered by alcohol. Second seizure episode in the last 24 hours. Initial encounter.  EXAM: MRI HEAD WITHOUT AND WITH CONTRAST  TECHNIQUE: Multiplanar, multiecho pulse sequences of the brain and surrounding structures were obtained without and with intravenous contrast.  CONTRAST:  17mL MULTIHANCE GADOBENATE DIMEGLUMINE 529 MG/ML IV SOLN  COMPARISON:  Head CT without contrast 10/20/2014.  FINDINGS: Cerebral volume is normal. No restricted diffusion to suggest acute infarction. No midline shift, mass effect, evidence of mass lesion, ventriculomegaly, extra-axial collection or acute intracranial hemorrhage. Cervicomedullary junction and pituitary are within normal limits. Negative visualized cervical spine. Major intracranial vascular flow voids are within normal limits.  Hippocampal formations and other mesial temporal lobe structures appear symmetric  and within normal limits. Bryan Curry and white matter signal is within normal limits throughout the brain. No abnormal enhancement identified.  Visible internal auditory structures appear normal. Mastoids are clear. Mild to  moderate paranasal sinus mucosal thickening. Orbit and scalp soft tissues appear normal. Normal bone marrow signal.  IMPRESSION: Normal MRI appearance of the brain.  Electronically Signed: By: Bryan Curry M.D. On: 10/20/2014 11:29   Dg Chest Portable 1 View  10/20/2014   CLINICAL DATA:  Acute onset of confusion and seizure like activity. Initial encounter.  EXAM: PORTABLE CHEST - 1 VIEW  COMPARISON:  Chest radiograph and CTA of the chest performed 07/22/2012  FINDINGS: The lungs are well-aerated and clear. There is no evidence of focal opacification, pleural effusion or pneumothorax.  The cardiomediastinal silhouette is borderline normal in size. No acute osseous abnormalities are seen.  IMPRESSION: No acute cardiopulmonary process seen.   Electronically Signed   By: Bryan Curry M.D.   On: 10/20/2014 02:56    MEDICATIONS                                                                                                                        Scheduled: . enoxaparin (LOVENOX) injection  40 mg Subcutaneous Q24H  . levETIRAcetam  500 mg Oral BID    ASSESSMENT/PLAN:                                                                                                            24 year old male with recurrent generalized seizure activity in association with alcohol consumption , normal EEG, but MRI brain with and without contrast with findings suggestive of small subependimal heterotopias.  Bryan Curry is at the bedside and said that " all of this is unnecessary because I don't think he really had a seizure, I know for sure this happened because he was so drunk". I explained to the patient and his fiancee that the MRI findings can not be disregarded, as heterotopias have a significant epileptogenic  potential, but at the same time expressed that  I am certainly in favor of pursuing epilepsy protocol MRI as outpatient to better define such finding. In the meantime, strongly recommended that patient continues taking keppra until we have a definite answer to the cause of his paroxysmal events. Further, I advised him to seek professional advise with regards to his alcohol consumption, and stressed the fact that according to [place] law he must be seizure free for at least 6 months before resuming driving. No swimming alone, operating heavy machinery, no climbing, no risky tasks recommended. Neurology will sign off.  Bryan Portela, MD [facility] [phone]  10/21/2014, 7:41 AM

## 2014-10-21 NOTE — Progress Notes (Signed)
Patient continues to refuse Keppra educated patient on importance of taking his medication pt. Verbalized understanding but continues to refuse.

## 2015-06-23 ENCOUNTER — Encounter (HOSPITAL_COMMUNITY): Payer: Self-pay

## 2015-06-23 ENCOUNTER — Emergency Department (HOSPITAL_COMMUNITY)
Admission: EM | Admit: 2015-06-23 | Discharge: 2015-06-23 | Disposition: A | Discharge status: Home / Self Care | Payer: BLUE CROSS/BLUE SHIELD | Attending: Emergency Medicine | Admitting: Emergency Medicine

## 2015-06-23 DIAGNOSIS — Z79899 Other long term (current) drug therapy: Secondary | ICD-10-CM | POA: Insufficient documentation

## 2015-06-23 DIAGNOSIS — F10120 Alcohol abuse with intoxication, uncomplicated: Secondary | ICD-10-CM | POA: Diagnosis not present

## 2015-06-23 DIAGNOSIS — F10920 Alcohol use, unspecified with intoxication, uncomplicated: Secondary | ICD-10-CM

## 2015-06-23 DIAGNOSIS — F10129 Alcohol abuse with intoxication, unspecified: Secondary | ICD-10-CM | POA: Diagnosis present

## 2015-06-23 LAB — CBG MONITORING, ED: Glucose-Capillary: 88 mg/dL (ref 65–99)

## 2015-06-23 MED ORDER — SODIUM CHLORIDE 0.9 % IV BOLUS (SEPSIS)
1000.0000 mL | Freq: Once | INTRAVENOUS | Status: AC
  Administered 2015-06-23: 1000 mL via INTRAVENOUS

## 2015-06-23 MED ORDER — SODIUM CHLORIDE 0.9 % IV SOLN
INTRAVENOUS | Status: DC
  Administered 2015-06-23: 04:00:00 via INTRAVENOUS

## 2015-06-23 NOTE — ED Provider Notes (Signed)
By signing my name below, I, Bethel Born, attest that this documentation has been prepared under the direction and in the presence of Magdalyn Arenivas N Myrl Bynum, DO. Electronically Signed: Bethel Born, ED Scribe. 06/23/2015. 2:49 AM.  TIME SEEN: 2:12 AM  CHIEF COMPLAINT: alcohol intoxication  HPI: Brought in by EMS,  Bryan Curry is a 25 y.o. male who presents to the Emergency Department with family for evaluation of alcohol intoxication. The pt was found unresponsive in a car at his uncles house after being seen consuming 6 beers. Associated symptoms include nausea and several episodes of emesis. His mother at bedside states that this is typical for him after a night of drinking, though he does not drink daily. She believes that this episode was triggered by the death of a loved one. The pt has had no witnessed trauma or falls tonight.    LEVEL V CAVEAT DUE TO INTOXICATION  PAST MEDICAL HISTORY/PAST SURGICAL HISTORY:  History reviewed. No pertinent past medical history.  MEDICATIONS:  Prior to Admission medications   Medication Sig Start Date End Date Taking? Authorizing Provider  albuterol (PROVENTIL HFA;VENTOLIN HFA) 108 (90 BASE) MCG/ACT inhaler Inhale 1-2 puffs into the lungs every 6 (six) hours as needed for wheezing or shortness of breath.    Historical Provider, MD  levETIRAcetam (KEPPRA) 500 MG tablet Take 1 tablet (500 mg total) by mouth 2 (two) times daily. 10/21/14   Lonia Blood, MD    ALLERGIES:  No Known Allergies  SOCIAL HISTORY:  Social History  Substance Use Topics  . Smoking status: Never Smoker   . Smokeless tobacco: Not on file  . Alcohol Use: Yes    FAMILY HISTORY: Family History  Problem Relation Age of Onset  . Cancer Paternal Grandfather    EXAM: CONSTITUTIONAL: Appears intoxicated and smells of alcohol, moves all extremities spontaneously but doesn't answer questions Or opens his eyes, appears to be protecting his airway HEAD: Normocephalic,  atraumatic EYES: Conjunctivae clear, PERRL ENT: normal nose; no rhinorrhea; moist mucous membranes NECK: Supple, no meningismus, no LAD, no midline spinal step off or deformity CARD: RRR; S1 and S2 appreciated; no murmurs, no clicks, no rubs, no gallops RESP: Normal chest excursion without splinting or tachypnea; breath sounds clear and equal bilaterally; no wheezes, no rhonchi, no rales, no hypoxia or respiratory distress, speaking full sentences ABD/GI: Normal bowel sounds; non-distended; soft, non-tender, no rebound, no guarding, no peritoneal signs BACK:  The back appears normal and is non-tender to palpation, there is no CVA tenderness,No midline step off or deformity EXT: Normal ROM in all joints; non-tender to palpation; no edema; normal capillary refill; no cyanosis, no calf tenderness or swelling; no signs of any bony deformity, compartments soft SKIN: Normal color for age and race; warm; no rash NEURO: Moves all extremities equally  MEDICAL DECISION MAKING: Patient here with alcohol intoxication. He appears to be protecting his airway, hemodynamically stable. Normal blood glucose. EKG obtained by nursing staff unremarkable. No sign of trauma on exam. Will hydrate patient and continue to monitor him. Family is at bedside. They're aware that he will need to be monitored until clinically sober were he can walk, talk, drink on his without difficulty.  ED PROGRESS: 7:00 AM  Patient is still clinically intoxicated. He is now able to open his eyes to painful stimuli but does not answer questions or follow commands. His family is still at bedside. Discussed with him that he will need to be more awake, able to drink and able  to ambulate before he can be discharged home with them. They are aware of this plan.  Signed out to oncoming physician.   EKG Interpretation  Date/Time:  Sunday Jun 23 2015 02:12:13 EDT Ventricular Rate:  75 PR Interval:  162 QRS Duration: 104 QT Interval:  403 QTC  Calculation: 450 R Axis:   87 Text Interpretation:  Sinus rhythm ST elev, probable normal early repol pattern No significant change since last tracing Confirmed by Delesia Martinek,  DO, Hamda Klutts [phone]([id number]) on 06/23/2015 2:23:25 AM        I personally performed the services described in this documentation, which was scribed in my presence. The recorded information has been reviewed and is accurate.     Bryan MawKristen N Kingston Guiles, DO 06/23/15 0700

## 2015-06-23 NOTE — ED Notes (Signed)
CBG 88 

## 2015-06-23 NOTE — ED Notes (Signed)
Per [facility]:: Pt had "6 beers" tonight. Pt drove his friends home after drinking. He did not come inside so the friends went out to check on him and found him unresponsive in the car. The friends dragged him out of the car and onto the ground. Pt was vomiting with EMS, EMS gave 4 mg of zofran IV and 250 ml of saline. EMS attempted to place an NPA after the pt stated "I can't breathe" and then vomited multiple times. The pt did not tolerate the NPA and took it out of his nose. Pt appears to have peed his pants and vomited on himself. Pt is non responsive to painful stimuli except NPA placement.

## 2015-06-23 NOTE — ED Notes (Signed)
Dr. Ward at bedside.

## 2015-06-23 NOTE — Discharge Instructions (Signed)
Alcohol Intoxication °Alcohol intoxication occurs when the amount of alcohol that a person has consumed impairs his or her ability to mentally and physically function. Alcohol directly impairs the normal chemical activity of the brain. Drinking large amounts of alcohol can lead to changes in mental function and behavior, and it can cause many physical effects that can be harmful.  °Alcohol intoxication can range in severity from mild to very severe. Various factors can affect the level of intoxication that occurs, such as the person's age, gender, weight, frequency of alcohol consumption, and the presence of other medical conditions (such as diabetes, seizures, or heart conditions). Dangerous levels of alcohol intoxication may occur when people drink large amounts of alcohol in a short period (binge drinking). Alcohol can also be especially dangerous when combined with certain prescription medicines or "recreational" drugs. °SIGNS AND SYMPTOMS °Some common signs and symptoms of mild alcohol intoxication include: °· Loss of coordination. °· Changes in mood and behavior. °· Impaired judgment. °· Slurred speech. °As alcohol intoxication progresses to more severe levels, other signs and symptoms will appear. These may include: °· Vomiting. °· Confusion and impaired memory. °· Slowed breathing. °· Seizures. °· Loss of consciousness. °DIAGNOSIS  °Your health care provider will take a medical history and perform a physical exam. You will be asked about the amount and type of alcohol you have consumed. Blood tests will be done to measure the concentration of alcohol in your blood. In many places, your blood alcohol level must be lower than 80 mg/dL (0.08%) to legally drive. However, many dangerous effects of alcohol can occur at much lower levels.  °TREATMENT  °People with alcohol intoxication often do not require treatment. Most of the effects of alcohol intoxication are temporary, and they go away as the alcohol naturally  leaves the body. Your health care provider will monitor your condition until you are stable enough to go home. Fluids are sometimes given through an IV access tube to help prevent dehydration.  °HOME CARE INSTRUCTIONS °· Do not drive after drinking alcohol. °· Stay hydrated. Drink enough water and fluids to keep your urine clear or pale yellow. Avoid caffeine.   °· Only take over-the-counter or prescription medicines as directed by your health care provider.   °SEEK MEDICAL CARE IF:  °· You have persistent vomiting.   °· You do not feel better after a few days. °· You have frequent alcohol intoxication. Your health care provider can help determine if you should see a substance use treatment counselor. °SEEK IMMEDIATE MEDICAL CARE IF:  °· You become shaky or tremble when you try to stop drinking.   °· You shake uncontrollably (seizure).   °· You throw up (vomit) blood. This may be bright red or may look like black coffee grounds.   °· You have blood in your stool. This may be bright red or may appear as a black, tarry, bad smelling stool.   °· You become lightheaded or faint.   °MAKE SURE YOU:  °· Understand these instructions. °· Will watch your condition. °· Will get help right away if you are not doing well or get worse. °  °This information is not intended to replace advice given to you by your health care provider. Make sure you discuss any questions you have with your health care provider. °  °Document Released: 11/12/2004 Document Revised: 10/05/2012 Document Reviewed: 07/08/2012 °Elsevier Interactive Patient Education ©2016 [employer] Inc[employer] ° ° ° °To find a primary care or specialty doctor please call [phone] or [phone] to access "[facility] Find a   Service."  You may also go on the [facility] website at [email or web address]  There are also multiple [facility] and [facility] practices throughout the [place] that are frequently accepting new patients. You may find a clinic  that is close to your home and contact them.  [facility] -  [address] [place] [phone]  [facility] in [place] (also locations in [place] and [place]) -  [address] [place] [address] [phone]  [facility] -  [address] [place] [address] [phone]

## 2015-09-11 IMAGING — CT CT HEAD W/O CM
1 of 2 series · 15 of 30 positions shown, 19 images · non-contrast
Comparison: None.

CLINICAL DATA: Acute onset of seizure activity. Patient
unresponsive. Overdose. Initial encounter.

EXAM:
CT HEAD WITHOUT CONTRAST
TECHNIQUE: Contiguous axial images were obtained from the base of the skull
through the vertex without intravenous contrast.

[Series 3: head 2.0 h70h · axial · 0.42mm/px · z∈[+205,+339]mm · 15 of 75 slices shown, 19 images]
[im 4/75  brain]
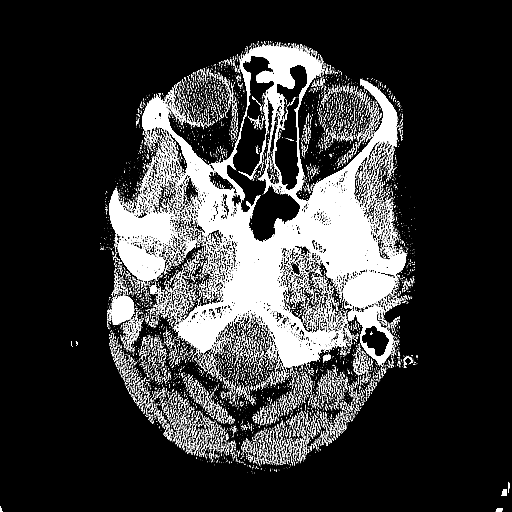
[im 4/75  bone]
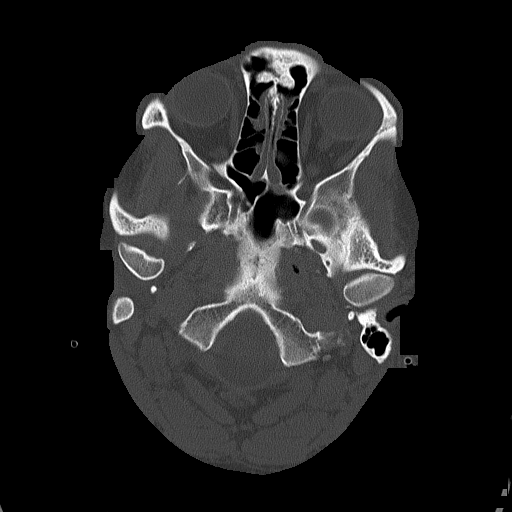
[im 8/75  brain]
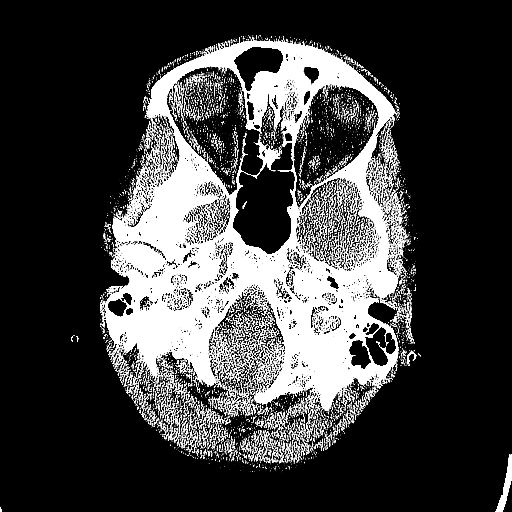
[im 15/75  brain]
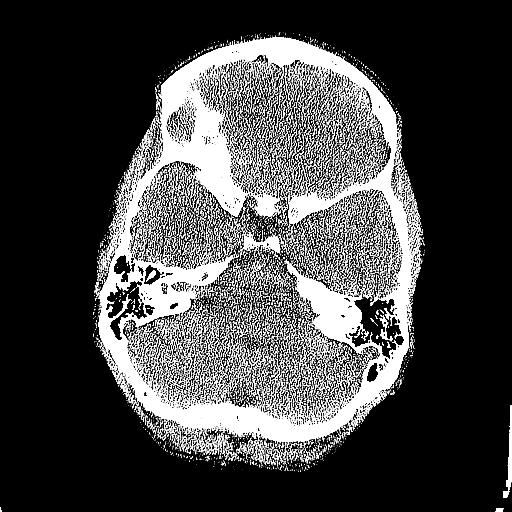
[im 19/75  brain]
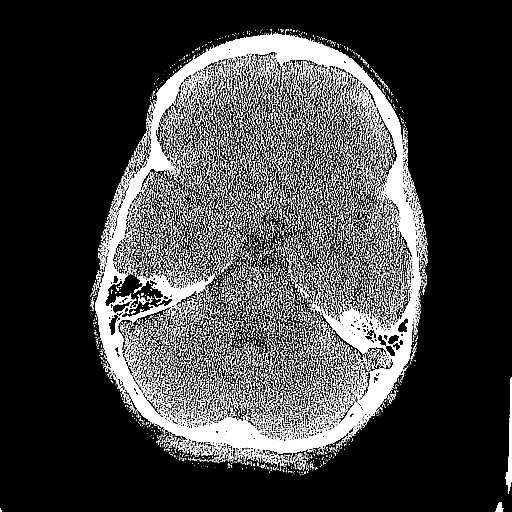
[im 23/75  brain]
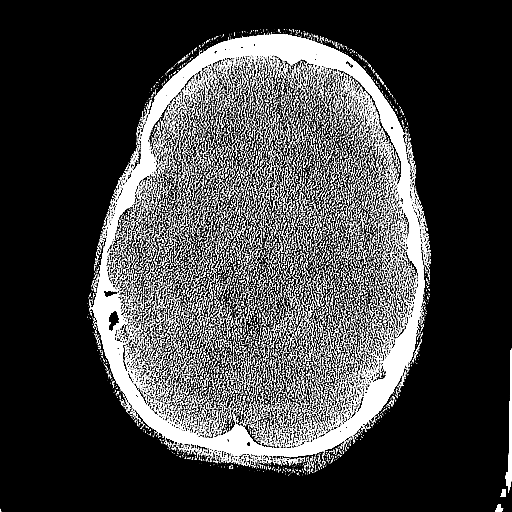
[im 23/75  bone]
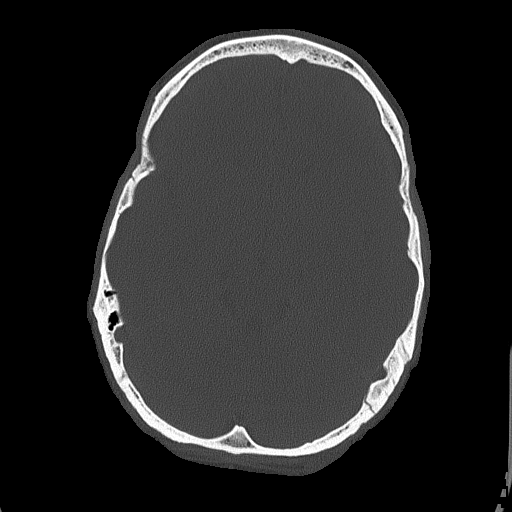
[im 26/75  brain]
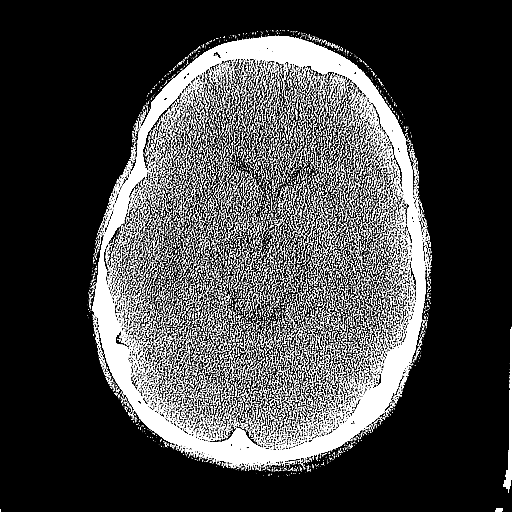
[im 34/75  brain]
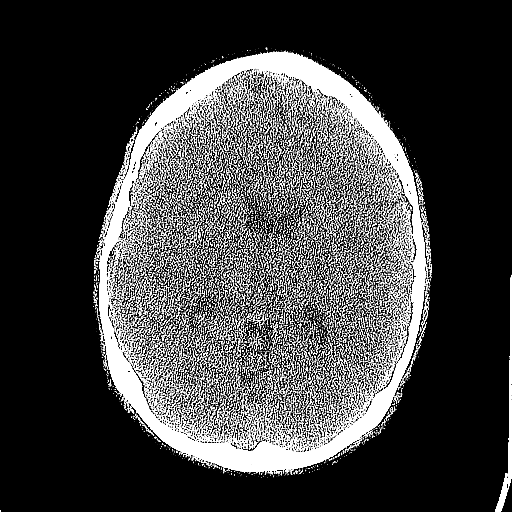
[im 38/75  brain]
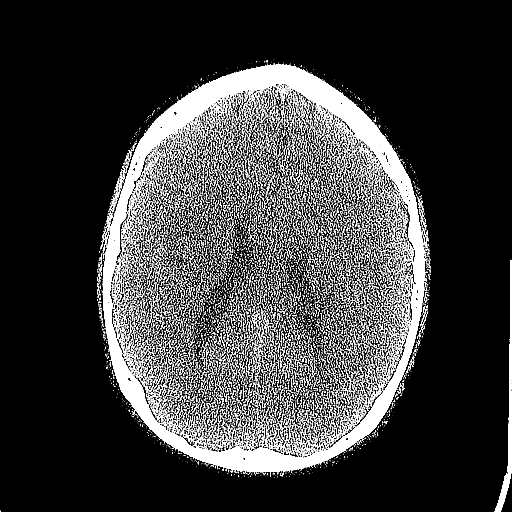
[im 41/75  brain]
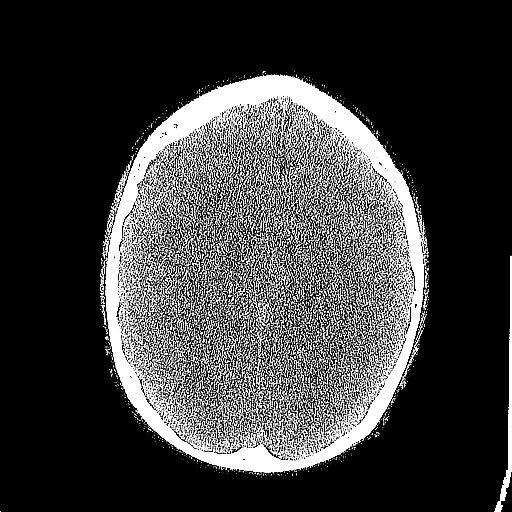
[im 41/75  bone]
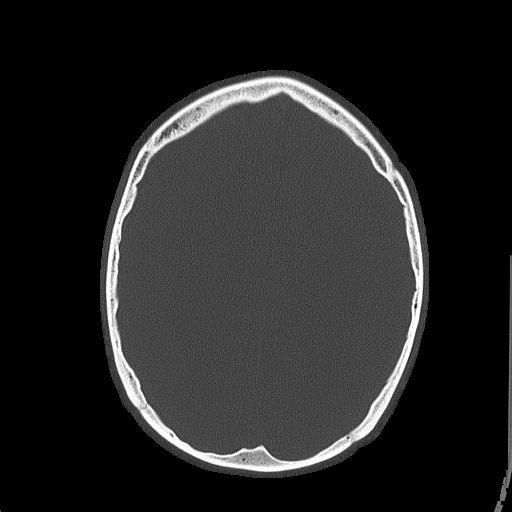
[im 49/75  brain]
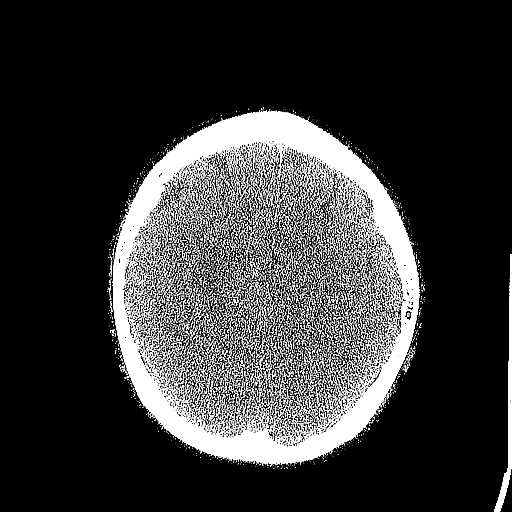
[im 52/75  brain]
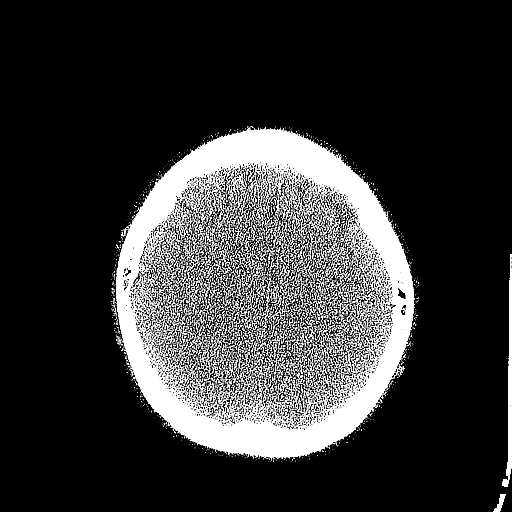
[im 56/75  brain]
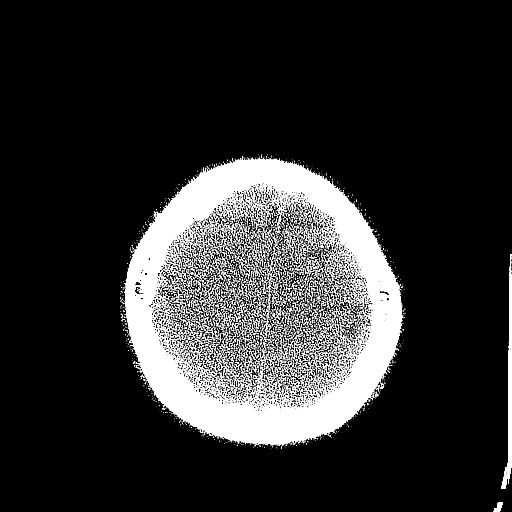
[im 60/75  brain]
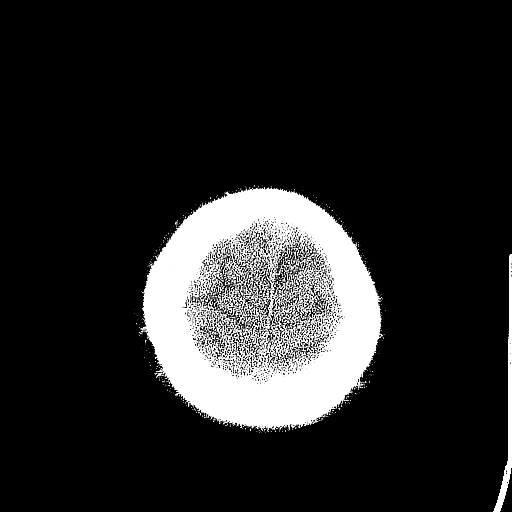
[im 60/75  bone]
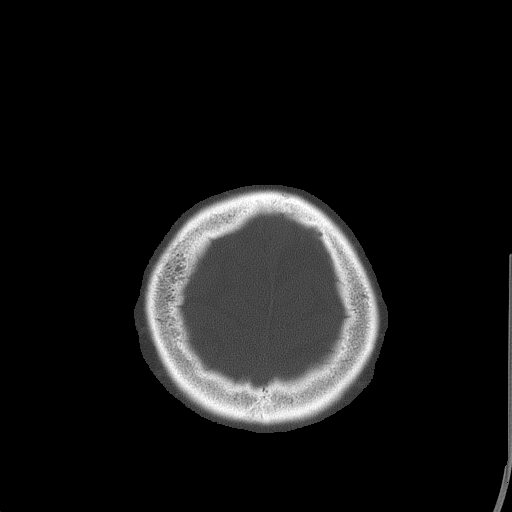
[im 67/75  brain]
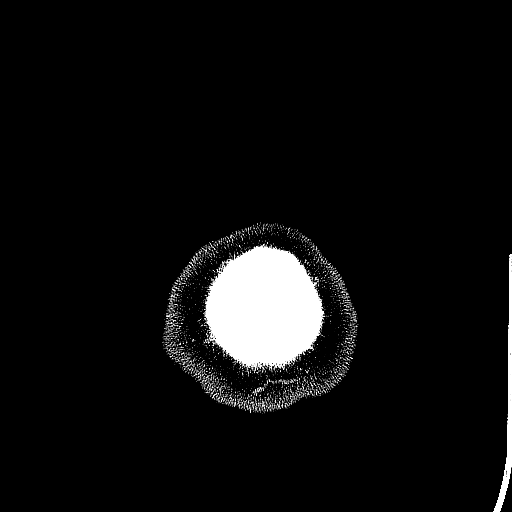
[im 71/75  brain]
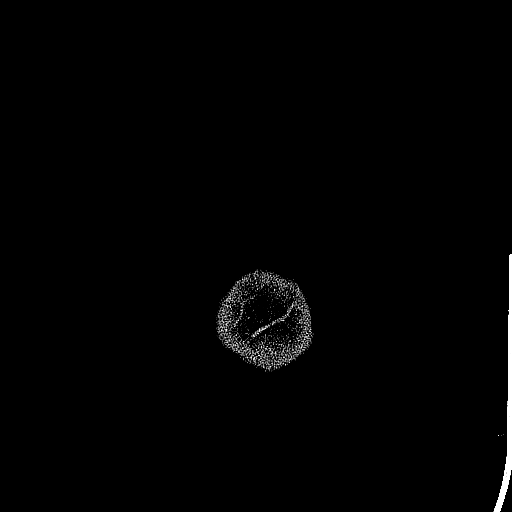

[15 of 30 positions shown; findings below may reference images not displayed]

FINDINGS: There is no evidence of acute infarction, mass lesion, or intra- or
extra-axial hemorrhage on CT.

The posterior fossa, including the cerebellum, brainstem and fourth
ventricle, is within normal limits. The third and lateral
ventricles, and basal ganglia are unremarkable in appearance. The
cerebral hemispheres are symmetric in appearance, with normal
gray-white differentiation. No mass effect or midline shift is seen.

There is no evidence of fracture; visualized osseous structures are
unremarkable in appearance. The visualized portions of the orbits
are within normal limits. The paranasal sinuses and mastoid air
cells are well-aerated. No significant soft tissue abnormalities are
seen.
IMPRESSION: Unremarkable noncontrast CT of the head.

## 2016-05-19 ENCOUNTER — Emergency Department (HOSPITAL_COMMUNITY)
Admission: EM | Admit: 2016-05-19 | Discharge: 2016-05-19 | Disposition: A | Discharge status: Home / Self Care | Payer: Worker's Compensation | Attending: Emergency Medicine | Admitting: Emergency Medicine

## 2016-05-19 ENCOUNTER — Encounter (HOSPITAL_COMMUNITY): Payer: Self-pay

## 2016-05-19 ENCOUNTER — Emergency Department (HOSPITAL_COMMUNITY): Payer: Worker's Compensation

## 2016-05-19 DIAGNOSIS — S20212A Contusion of left front wall of thorax, initial encounter: Secondary | ICD-10-CM | POA: Diagnosis not present

## 2016-05-19 DIAGNOSIS — R1030 Lower abdominal pain, unspecified: Secondary | ICD-10-CM | POA: Diagnosis not present

## 2016-05-19 DIAGNOSIS — Y92410 Unspecified street and highway as the place of occurrence of the external cause: Secondary | ICD-10-CM | POA: Diagnosis not present

## 2016-05-19 DIAGNOSIS — Y9389 Activity, other specified: Secondary | ICD-10-CM | POA: Diagnosis not present

## 2016-05-19 DIAGNOSIS — S299XXA Unspecified injury of thorax, initial encounter: Secondary | ICD-10-CM | POA: Diagnosis present

## 2016-05-19 DIAGNOSIS — Y999 Unspecified external cause status: Secondary | ICD-10-CM | POA: Diagnosis not present

## 2016-05-19 LAB — COMPREHENSIVE METABOLIC PANEL
ALT: 17 U/L (ref 17–63)
AST: 23 U/L (ref 15–41)
Albumin: 4 g/dL (ref 3.5–5.0)
Alkaline Phosphatase: 60 U/L (ref 38–126)
Anion gap: 7 (ref 5–15)
BUN: 15 mg/dL (ref 6–20)
CO2: 24 mmol/L (ref 22–32)
Calcium: 9 mg/dL (ref 8.9–10.3)
Chloride: 107 mmol/L (ref 101–111)
Creatinine, Ser: 0.78 mg/dL (ref 0.61–1.24)
GFR calc Af Amer: >60 mL/min (ref >60)
GFR calc non Af Amer: >60 mL/min (ref >60)
Glucose, Bld: 97 mg/dL (ref 65–99)
Potassium: 3.7 mmol/L (ref 3.5–5.1)
Sodium: 138 mmol/L (ref 135–145)
Total Bilirubin: 0.7 mg/dL (ref 0.3–1.2)
Total Protein: 6.5 g/dL (ref 6.5–8.1)

## 2016-05-19 LAB — CBC WITH DIFFERENTIAL/PLATELET
Basophils Absolute: 0 10*3/uL (ref 0.0–0.1)
Basophils Relative: 0 %
Eosinophils Absolute: 0.1 10*3/uL (ref 0.0–0.7)
Eosinophils Relative: 1 %
HCT: 43.6 % (ref 39.0–52.0)
Hemoglobin: 15.2 g/dL (ref 13.0–17.0)
Lymphocytes Relative: 16 %
Lymphs Abs: 1.8 10*3/uL (ref 0.7–4.0)
MCH: 31.4 pg (ref 26.0–34.0)
MCHC: 34.9 g/dL (ref 30.0–36.0)
MCV: 90.1 fL (ref 78.0–100.0)
Monocytes Absolute: 0.5 10*3/uL (ref 0.1–1.0)
Monocytes Relative: 4 %
Neutro Abs: 9.1 10*3/uL — ABNORMAL HIGH (ref 1.7–7.7)
Neutrophils Relative %: 79 %
Platelets: 191 10*3/uL (ref 150–400)
RBC: 4.84 MIL/uL (ref 4.22–5.81)
RDW: 12.4 % (ref 11.5–15.5)
WBC: 11.6 10*3/uL — ABNORMAL HIGH (ref 4.0–10.5)

## 2016-05-19 LAB — LIPASE, BLOOD: Lipase: 13 U/L (ref 11–51)

## 2016-05-19 MED ORDER — SODIUM CHLORIDE 0.9 % IV BOLUS (SEPSIS)
1000.0000 mL | Freq: Once | INTRAVENOUS | Status: AC
  Administered 2016-05-19: 1000 mL via INTRAVENOUS

## 2016-05-19 MED ORDER — MORPHINE SULFATE (PF) 4 MG/ML IV SOLN
4.0000 mg | Freq: Once | INTRAVENOUS | Status: DC
  Filled 2016-05-19: qty 1

## 2016-05-19 MED ORDER — CYCLOBENZAPRINE HCL 10 MG PO TABS: 10.0000 mg | ORAL_TABLET | Freq: Two times a day (BID) | ORAL | 0 refills | Status: DC | PRN

## 2016-05-19 MED ORDER — IOPAMIDOL (ISOVUE-300) INJECTION 61%
INTRAVENOUS | Status: AC
  Administered 2016-05-19: 100 mL
  Filled 2016-05-19: qty 100

## 2016-05-19 NOTE — ED Notes (Signed)
Pt refused Morphine.  Does not want anything for pain at this time.

## 2016-05-19 NOTE — ED Notes (Signed)
Incentive spirometer and instructions for use given to patient.
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
## 2016-05-19 NOTE — ED Notes (Signed)
Pt taken to CT.
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
## 2016-09-09 IMAGING — DX DG ELBOW 2V*R*
2 series · 2 of 2 positions shown · non-contrast
Comparison: None.

CLINICAL DATA: Syncope. Fell on concrete. Right posterior elbow
pain.

EXAM:
RIGHT ELBOW - 2 VIEW

[elbow ap]
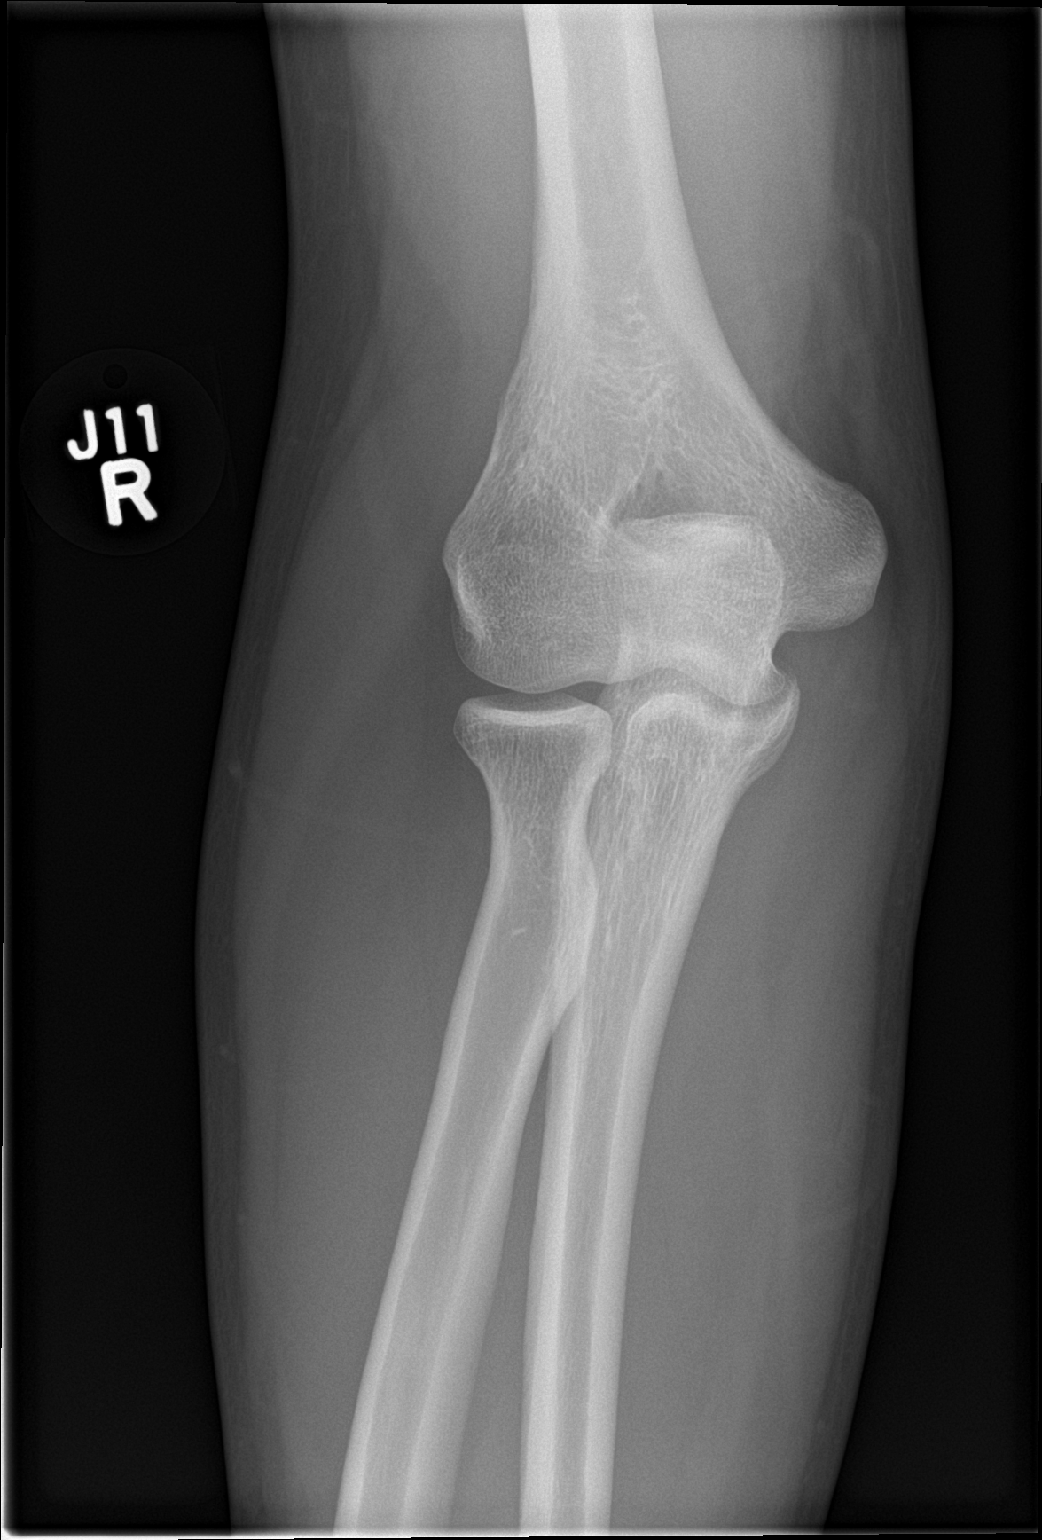

[elbow lat]
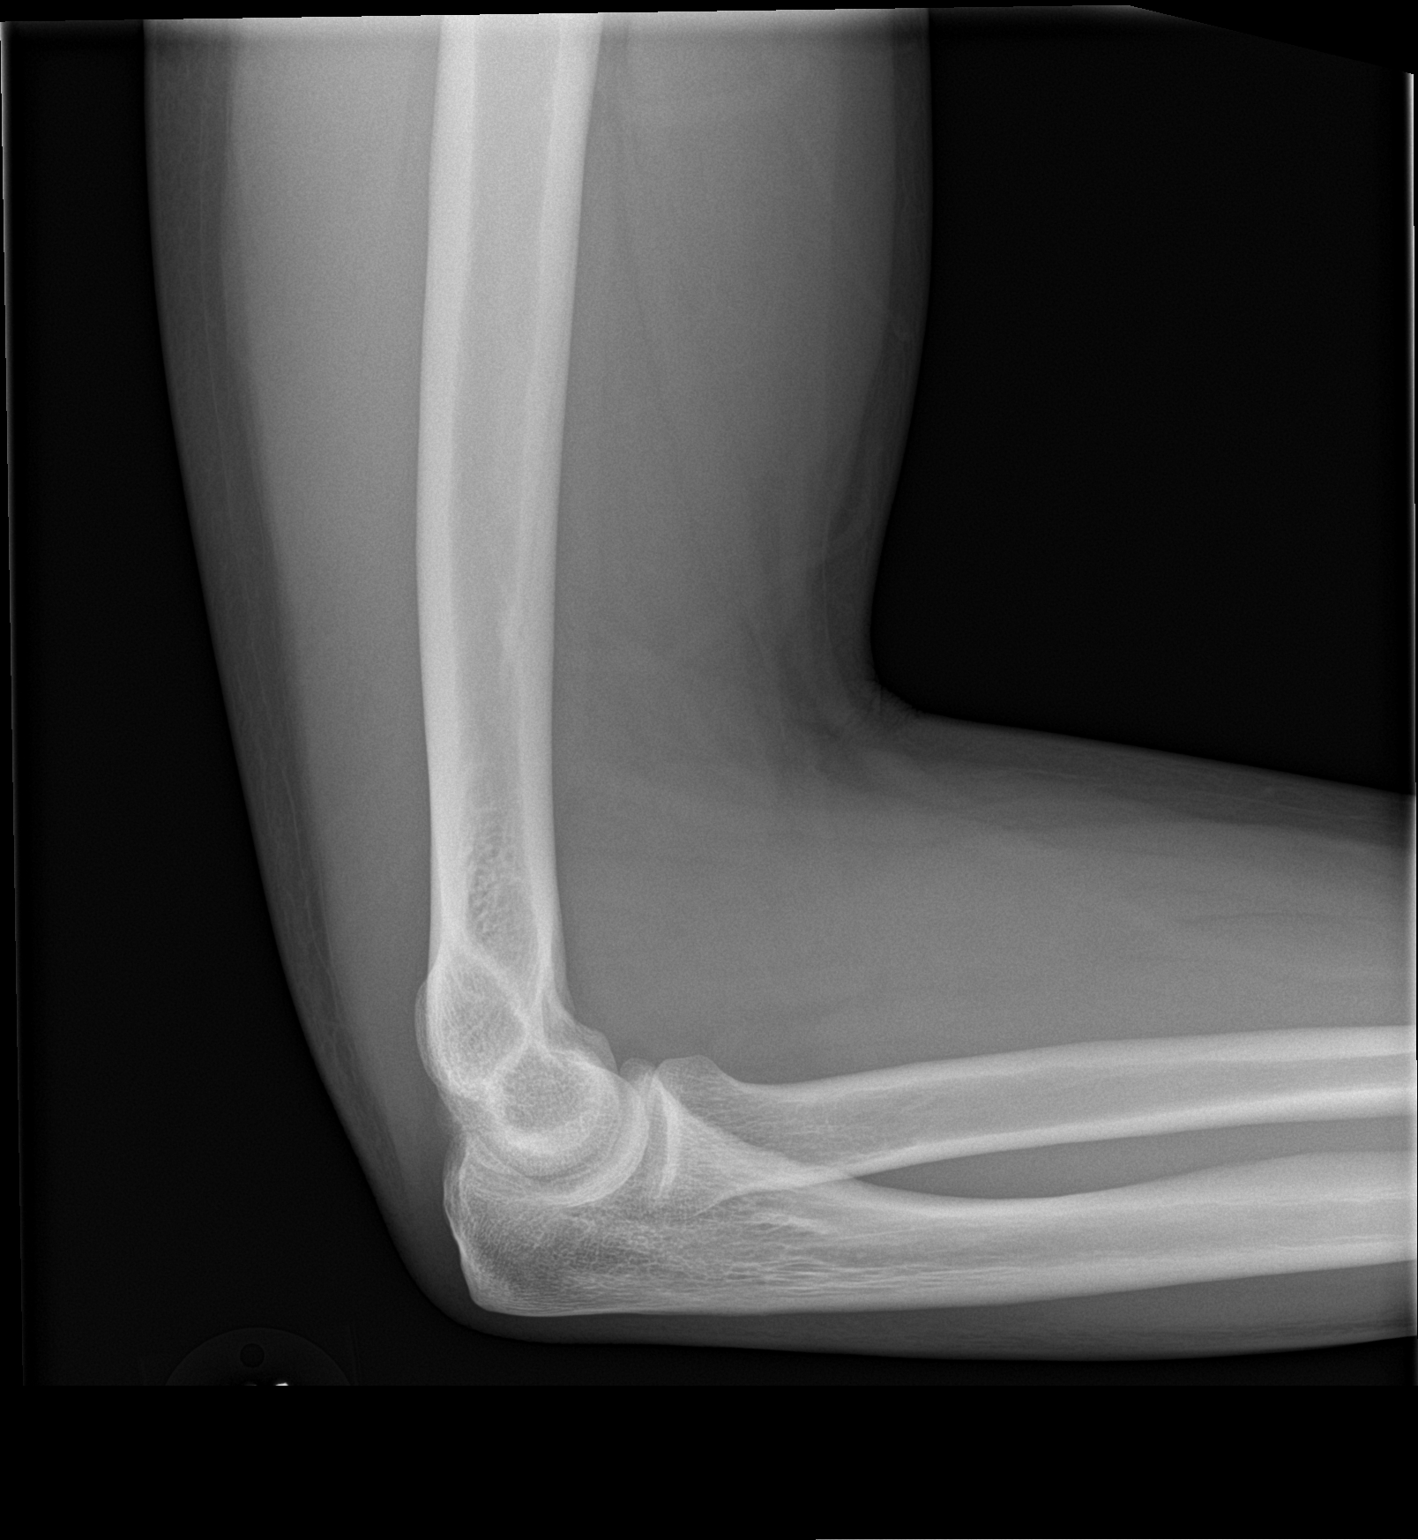

[2 of 2 positions shown; findings below may reference images not displayed]

FINDINGS: There is no evidence of fracture, dislocation, or joint effusion.
There is no evidence of arthropathy or other focal bone abnormality.
Soft tissues are unremarkable.
IMPRESSION: Negative.

## 2016-10-01 ENCOUNTER — Emergency Department (HOSPITAL_COMMUNITY): Payer: BLUE CROSS/BLUE SHIELD

## 2016-10-01 ENCOUNTER — Emergency Department (HOSPITAL_COMMUNITY)
Admission: EM | Admit: 2016-10-01 | Discharge: 2016-10-02 | Disposition: A | Discharge status: Home / Self Care | Payer: BLUE CROSS/BLUE SHIELD | Attending: Emergency Medicine | Admitting: Emergency Medicine

## 2016-10-01 ENCOUNTER — Encounter (HOSPITAL_COMMUNITY): Payer: Self-pay | Admitting: Emergency Medicine

## 2016-10-01 DIAGNOSIS — R200 Anesthesia of skin: Secondary | ICD-10-CM | POA: Diagnosis not present

## 2016-10-01 DIAGNOSIS — M79604 Pain in right leg: Secondary | ICD-10-CM

## 2016-10-01 DIAGNOSIS — M25572 Pain in left ankle and joints of left foot: Secondary | ICD-10-CM | POA: Diagnosis present

## 2016-10-01 DIAGNOSIS — M79605 Pain in left leg: Secondary | ICD-10-CM

## 2016-10-01 DIAGNOSIS — M25571 Pain in right ankle and joints of right foot: Secondary | ICD-10-CM | POA: Insufficient documentation

## 2016-10-01 DIAGNOSIS — R208 Other disturbances of skin sensation: Secondary | ICD-10-CM

## 2016-10-01 MED ORDER — OXYCODONE-ACETAMINOPHEN 5-325 MG PO TABS
ORAL_TABLET | ORAL | Status: AC
  Filled 2016-10-01: qty 1

## 2016-10-01 MED ORDER — OXYCODONE-ACETAMINOPHEN 5-325 MG PO TABS
1.0000 | ORAL_TABLET | ORAL | Status: DC | PRN
  Administered 2016-10-01: 1 via ORAL

## 2016-10-01 MED ORDER — OXYCODONE-ACETAMINOPHEN 5-325 MG PO TABS: 1.0000 | ORAL_TABLET | ORAL | 0 refills | Status: DC | PRN

## 2016-10-01 MED ORDER — OXYCODONE-ACETAMINOPHEN 5-325 MG PO TABS
1.0000 | ORAL_TABLET | Freq: Once | ORAL | Status: AC
  Administered 2016-10-01: 1 via ORAL
  Filled 2016-10-01: qty 1

## 2016-10-01 NOTE — ED Triage Notes (Signed)
Pt here after porch fell on bilateral ankles; pulses present but pt unable to move toes and no feeling to right foot; EDP notified

## 2016-10-01 NOTE — ED Notes (Signed)
Pt notified this tech of need for pain medicine. RN made aware.

## 2016-10-01 NOTE — ED Provider Notes (Signed)
Emergency Department Provider Note   I have reviewed the triage vital signs and the nursing notes.   HISTORY  Chief Complaint Ankle Injury   HPI Bryan Curry is a 26 y.o. male with PMH of seizure disorder presents to the emergency department for evaluation of bilateral foot/ankle pain with numbness. The patient was working on a a [employer] when the deck suddenly fell and landed on both ankles.He describes severe pain in the ankles with numbness in the entire foot bilaterally. The deck did not fall in any other part of his body. He has been unable to bear weight since the incident. No knee or leg pain. No fall with head trauma and loss of consciousness. No history of neuropathy. No radiation of symptoms. No lower back pain.    History reviewed. No pertinent past medical history.  Patient Active Problem List   Diagnosis Date Noted  . Seizure (HCC) 10/20/2014  . Tinea versicolor 04/25/2013    History reviewed. No pertinent surgical history.  Current Outpatient Rx  . Order #: [id number] Class: Historical Med  . Order #: [id number] Class: Print  . Order #: [id number] Class: Print    Allergies Patient has no known allergies.  Family History  Problem Relation Age of Onset  . Cancer Paternal Grandfather     Social History Social History  Substance Use Topics  . Smoking status: Never Smoker  . Smokeless tobacco: Never Used  . Alcohol use No    Review of Systems  Constitutional: No fever/chills Eyes: No visual changes. ENT: No sore throat. Cardiovascular: Denies chest pain. Respiratory: Denies shortness of breath. Gastrointestinal: No abdominal pain.  No nausea, no vomiting.  No diarrhea.  No constipation. Genitourinary: Negative for dysuria. Musculoskeletal: Negative for back pain. Positive bilateral foot and ankle pain.  Skin: Negative for rash. Neurological: Negative for headaches, focal weakness or numbness.  10-point ROS otherwise  negative.  ____________________________________________   PHYSICAL EXAM:  VITAL SIGNS: ED Triage Vitals  Enc Vitals Group     BP 10/01/16 1645 135/81     Pulse Rate 10/01/16 1645 65     Resp 10/01/16 1645 18     Temp 10/01/16 1645 97.7 F (36.5 C)     Temp Source 10/01/16 1645 Oral     SpO2 10/01/16 1645 95 %     Pain Score 10/01/16 1643 10   Constitutional: Alert and oriented. Well appearing and in no acute distress. Eyes: Conjunctivae are normal.  Head: Atraumatic. Nose: No congestion/rhinnorhea. Mouth/Throat: Mucous membranes are moist.  Neck: No stridor.  Cardiovascular: Normal rate, regular rhythm. Good peripheral circulation. Grossly normal heart sounds.   Respiratory: Normal respiratory effort.  No retractions. Lungs CTAB. Gastrointestinal: Soft and nontender. No distention.  Musculoskeletal: Bilateral ankle edema. Diffuse tenderness. No laceration or abrasion. No gross deformities of extremities. No proximal fibular tenderness.  Neurologic:  Normal speech and language. No gross focal neurologic deficits are appreciated.  Skin:  Skin is warm, dry and intact. No rash noted.  ____________________________________________  RADIOLOGY  Dg Ankle Complete Left  Result Date: 10/01/2016 CLINICAL DATA:  Bilateral ankle pain and swelling. Injury, porch fell on both ankles. EXAM: LEFT ANKLE COMPLETE - 3+ VIEW COMPARISON:  None. FINDINGS: There is no evidence of fracture, dislocation, or joint effusion. There is no evidence of arthropathy or other focal bone abnormality. Soft tissues are unremarkable. IMPRESSION: Negative radiographs of the left ankle. Electronically Signed   By: Rubye OaksMelanie  Ehinger M.D.   On: 10/01/2016 17:41   Dg Ankle  Complete Right  Result Date: 10/01/2016 CLINICAL DATA:  Bilateral ankle pain and swelling. Injury, porch fell on both ankles. EXAM: RIGHT ANKLE - COMPLETE 3+ VIEW COMPARISON:  None. FINDINGS: There is no evidence of fracture, dislocation, or joint  effusion. There is no evidence of arthropathy or other focal bone abnormality. Punctate density overlies the scanned about the lateral ankle. Soft tissues are unremarkable. IMPRESSION: 1. Punctate density overlies the skin of the right ankle, may be external to the patient or tiny foreign bodies in the dermal soft tissues. 2. No osseous abnormality. Electronically Signed   By: Rubye Oaks M.D.   On: 10/01/2016 17:42   Dg Foot 2 Views Left  Result Date: 10/01/2016 CLINICAL DATA:  Porch fell on patient's left foot, with pain. Initial encounter. EXAM: LEFT FOOT - 2 VIEW COMPARISON:  None. FINDINGS: There is no evidence of fracture or dislocation. The joint spaces are preserved. There is no evidence of talar subluxation; the subtalar joint is unremarkable in appearance. No significant soft tissue abnormalities are seen. IMPRESSION: No evidence of fracture or dislocation. Electronically Signed   By: [name] M.D.   On: 10/01/2016 22:54   Dg Foot 2 Views Right  Result Date: 10/01/2016 CLINICAL DATA:  Porch fell on patient's right foot, with pain. Initial encounter. EXAM: RIGHT FOOT - 2 VIEW COMPARISON:  None. FINDINGS: There is no evidence of fracture or dislocation. The joint spaces are preserved. There is no evidence of talar subluxation; the subtalar joint is unremarkable in appearance. An ankle joint effusion is noted. IMPRESSION: 1. No evidence of fracture or dislocation. 2. Ankle joint effusion noted. Electronically Signed   By: [name] M.D.   On: 10/01/2016 22:54    ____________________________________________   PROCEDURES  Procedure(s) performed:   Procedures  None ____________________________________________   INITIAL IMPRESSION / ASSESSMENT AND PLAN / ED COURSE  Pertinent labs & imaging results that were available during my care of the patient were reviewed by me and considered in my medical decision making (see chart for details).  Patient presents to the emergency  department for evaluation of bilateral ankle pain with swelling. X-rays from the waiting room are negative for fracture or dislocation. Patient is describing numbness in his entire foot. There is no specific dermatomal distribution for his numbness.  I performed a sharp/dull differentiation testing and the patient is inconsistent. Plan for weightbearing films of bilateral feet to rule out midfoot injury. Plan to give additional pain medication and ambulate.   11:50 PM Plain films of foot negative. Weigth bearing films with no evidence of mid-foot injury. Patient having improved mobility and feeling in the feet. Patient ambulated in the ED with moderate pain but gait was steady. Discharge with PCP and info for Neurology follow up if numbness continues.   At this time, I do not feel there is any life-threatening condition present. I have reviewed and discussed all results (EKG, imaging, lab, urine as appropriate), exam findings with patient. I have reviewed nursing notes and appropriate previous records.  I feel the patient is safe to be discharged home without further emergent workup. Discussed usual and customary return precautions. Patient and family (if present) verbalize understanding and are comfortable with this plan.  Patient will follow-up with their primary care provider. If they do not have a primary care provider, information for follow-up has been provided to them. All questions have been answered.  ____________________________________________  FINAL CLINICAL IMPRESSION(S) / ED DIAGNOSES  Final diagnoses:  Left leg pain  Right leg pain  Numbness of right foot  Numbness of left foot     MEDICATIONS GIVEN DURING THIS VISIT:  Medications  oxyCODONE-acetaminophen (PERCOCET/ROXICET) 5-325 MG per tablet 1 tablet (1 tablet Oral Given 10/01/16 1902)  oxyCODONE-acetaminophen (PERCOCET/ROXICET) 5-325 MG per tablet (not administered)  oxyCODONE-acetaminophen (PERCOCET/ROXICET) 5-325 MG per  tablet 1 tablet (1 tablet Oral Given 10/01/16 2228)     NEW OUTPATIENT MEDICATIONS STARTED DURING THIS VISIT:  Discharge Medication List as of 10/01/2016 11:54 PM    START taking these medications   Details  oxyCODONE-acetaminophen (PERCOCET/ROXICET) 5-325 MG tablet Take 1 tablet by mouth every 4 (four) hours as needed for severe pain., Starting Thu 10/01/2016, Print          Note:  This document was prepared using Dragon voice recognition software and may include unintentional dictation errors.  Alona Bene, MD Emergency Medicine    Brydon Spahr, Arlyss Repress, MD 10/02/16 [id number]

## 2016-10-01 NOTE — Discharge Instructions (Signed)
You were seen today with pain and numbness in the feet and ankles. This should resolve with time. Take pain medication only if Motrin does not improve the symptoms. If numbness continues call the Neurologist listed below.   Return to the ED with any new or worsening symptoms.

## 2021-09-01 ENCOUNTER — Other Ambulatory Visit: Payer: Self-pay

## 2021-09-01 ENCOUNTER — Encounter (HOSPITAL_COMMUNITY): Payer: Self-pay | Admitting: Emergency Medicine

## 2021-09-01 ENCOUNTER — Emergency Department (HOSPITAL_COMMUNITY): Payer: Self-pay

## 2021-09-01 ENCOUNTER — Emergency Department (HOSPITAL_COMMUNITY)
Admission: EM | Admit: 2021-09-01 | Discharge: 2021-09-01 | Disposition: A | Discharge status: Home / Self Care | Payer: Self-pay | Attending: Emergency Medicine | Admitting: Emergency Medicine

## 2021-09-01 DIAGNOSIS — R0789 Other chest pain: Secondary | ICD-10-CM | POA: Insufficient documentation

## 2021-09-01 LAB — CBC
HCT: 43.5 % (ref 39.0–52.0)
Hemoglobin: 15.3 g/dL (ref 13.0–17.0)
MCH: 32.1 pg (ref 26.0–34.0)
MCHC: 35.2 g/dL (ref 30.0–36.0)
MCV: 91.4 fL (ref 80.0–100.0)
Platelets: 183 10*3/uL (ref 150–400)
RBC: 4.76 MIL/uL (ref 4.22–5.81)
RDW: 12.4 % (ref 11.5–15.5)
WBC: 6.4 10*3/uL (ref 4.0–10.5)
nRBC: 0 % (ref 0.0–0.2)

## 2021-09-01 LAB — TROPONIN I (HIGH SENSITIVITY)
Troponin I (High Sensitivity): <2 ng/L (ref <18)
Troponin I (High Sensitivity): <2 ng/L (ref <18)

## 2021-09-01 LAB — BASIC METABOLIC PANEL
Anion gap: 7 (ref 5–15)
BUN: 10 mg/dL (ref 6–20)
CO2: 25 mmol/L (ref 22–32)
Calcium: 9 mg/dL (ref 8.9–10.3)
Chloride: 109 mmol/L (ref 98–111)
Creatinine, Ser: 0.96 mg/dL (ref 0.61–1.24)
GFR, Estimated: >60 mL/min (ref >60)
Glucose, Bld: 133 mg/dL — ABNORMAL HIGH (ref 70–99)
Potassium: 3.8 mmol/L (ref 3.5–5.1)
Sodium: 141 mmol/L (ref 135–145)

## 2021-09-01 MED ORDER — CYCLOBENZAPRINE HCL 5 MG PO TABS: 5.0000 mg | ORAL_TABLET | Freq: Three times a day (TID) | ORAL | 0 refills | Status: DC | PRN

## 2021-09-01 NOTE — Discharge Instructions (Signed)
Take Motrin for pain and Flexeril for muscle spasms.  As we discussed, your heart enzyme test is normal right now I do not think you have a heart attack.  You likely have strained the muscles in your chest.  Please rest for 2 days.  Follow-up with your doctor  Return to ER if you have worse chest pain, shortness of breath

## 2021-09-01 NOTE — ED Provider Triage Note (Signed)
Emergency Medicine Provider Triage Evaluation Note  Bryan Curry , a 31 y.o. male  was evaluated in triage.  Pt complains of chest pain. Chest pain started yesterday at rest. It is in his left chest and radiates all the way down his left arm. Describes it as stabbing. Nothing makes it better or worse. Never had this pain before. Endorses some associated shortness of breath.  Denies drug use. Denies recent illness.  Review of Systems  Positive: Chest pain, shortness of breath Negative:   Physical Exam  BP (!) 153/101 (BP Location: Right Arm)   Pulse 94   Temp 98.3 F (36.8 C) (Oral)   Resp 13   SpO2 99%  Gen:   Awake, no distress   Resp:  Normal effort  MSK:   Moves extremities without difficulty  Other:    Medical Decision Making  Medically screening exam initiated at 9:49 AM.  Appropriate orders placed.  Bryan Curry was informed that the remainder of the evaluation will be completed by another provider, this initial triage assessment does not replace that evaluation, and the importance of remaining in the ED until their evaluation is complete.     Claudie Leach, [place] 09/01/21 [phone]

## 2021-09-01 NOTE — ED Triage Notes (Signed)
PT states he started having chest pain last night while he was laying down. The pain has not stopped. Pain feels like a stabbing pain. Pt does feel sob at times. Denies any other symptoms.

## 2021-09-01 NOTE — ED Provider Notes (Signed)
[facility] EMERGENCY DEPARTMENT Provider Note   CSN: [id number] Arrival date & time: 09/01/21  9563     History  Chief Complaint  Patient presents with   Chest Pain    Bryan Curry is a 31 y.o. male here with chest pain.  Patient states that since yesterday, he has been having sharp stabbing left-sided chest pain down his arm.  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  He states that he has been working 7 days a week and under a lot of stress.  Denies any history of CAD or stents.  Patient states that he has been hydrating himself.  Denies any recent travel.  The history is provided by the patient.       Home Medications Prior to Admission medications   Medication Sig Start Date End Date Taking? Authorizing Provider  albuterol (PROVENTIL HFA;VENTOLIN HFA) 108 (90 BASE) MCG/ACT inhaler Inhale 1-2 puffs into the lungs every 6 (six) hours as needed for wheezing or shortness of breath.    [provider]  cyclobenzaprine (FLEXERIL) 10 MG tablet Take 1 tablet (10 mg total) by mouth 2 (two) times daily as needed for muscle spasms. 05/19/16   Alvira Monday, MD  oxyCODONE-acetaminophen (PERCOCET/ROXICET) 5-325 MG tablet Take 1 tablet by mouth every 4 (four) hours as needed for severe pain. 10/01/16   Long, Arlyss Repress, MD      Allergies    Patient has no known allergies.    Review of Systems   Review of Systems  Cardiovascular:  Positive for chest pain.  All other systems reviewed and are negative.   Physical Exam Updated Vital Signs BP (!) 139/100   Pulse 74   Temp 98.1 F (36.7 C)   Resp 16   SpO2 98%  Physical Exam Vitals and nursing note reviewed.  Constitutional:      Appearance: He is well-developed.  HENT:     Head: Normocephalic.  Eyes:     Extraocular Movements: Extraocular movements intact.     Pupils: Pupils are equal, round, and reactive to light.  Cardiovascular:     Rate and Rhythm: Normal rate and regular  rhythm.     Heart sounds: Normal heart sounds.  Pulmonary:     Effort: Pulmonary effort is normal.     Comments: Left chest wall tenderness Musculoskeletal:        General: Normal range of motion.     Cervical back: Neck supple.  Skin:    General: Skin is warm.     Capillary Refill: Capillary refill takes less than 2 seconds.  Neurological:     Mental Status: He is alert.  Psychiatric:        Mood and Affect: Mood normal.        Behavior: Behavior normal.     ED Results / Procedures / Treatments   Labs (all labs ordered are listed, but only abnormal results are displayed) Labs Reviewed  BASIC METABOLIC PANEL - Abnormal; Notable for the following components:      Result Value   Glucose, Bld 133 (*)    All other components within normal limits  CBC  TROPONIN I (HIGH SENSITIVITY)  TROPONIN I (HIGH SENSITIVITY)    EKG EKG Interpretation  Date/Time:  Monday September 01 2021 09:49:49 EDT Ventricular Rate:  98 PR Interval:    QRS Duration: 88 QT Interval:  352 QTC Calculation: 449 R Axis:   94 Text Interpretation: Atrial fibrillation Rightward axis Abnormal ECG When  compared with ECG of 23-Jun-2015 02:12, PREVIOUS ECG IS PRESENT Confirmed by Richardean Canal ([id number]) on 09/01/2021 5:19:14 PM  Radiology DG Chest 1 View  Result Date: 09/01/2021 CLINICAL DATA:  Chest pain and shortness of breath. EXAM: CHEST  1 VIEW COMPARISON:  05/19/2016 FINDINGS: 1003 hours. The lungs are clear without focal pneumonia, edema, pneumothorax or pleural effusion. The cardiopericardial silhouette is within normal limits for size. The visualized bony structures of the thorax are unremarkable. IMPRESSION: No active disease. Electronically Signed   By: Kennith Center M.D.   On: 09/01/2021 10:27    Procedures Procedures    Medications Ordered in ED Medications - No data to display  ED Course/ Medical Decision Making/ A&P                           Medical Decision Making [name] is a 31 y.o.  male here presenting with chest pain.  Low suspicion for ACS.  I doubt PE.  Plan to get CBC and BMP and troponin x2 and chest x-ray.  5:28 PM Reviewed patient's labs and imaging studies.  Troponin negative x2.  Chest x-ray is clear.  I think likely muscle strain. Plan to discharge home with Motrin and Flexeril.  Told him to rest for several days and no heavy lifting   Problems Addressed: Chest wall pain: acute illness or injury  Amount and/or Complexity of Data Reviewed Labs: ordered. Decision-making details documented in ED Course. Radiology: ordered and independent interpretation performed. Decision-making details documented in ED Course. ECG/medicine tests: ordered and independent interpretation performed. Decision-making details documented in ED Course.    Final Clinical Impression(s) / ED Diagnoses Final diagnoses:  None    Rx / DC Orders ED Discharge Orders     None         Charlynne Pander, MD 09/01/21 1729

## 2023-07-28 ENCOUNTER — Emergency Department (HOSPITAL_COMMUNITY): Payer: Self-pay

## 2023-07-28 ENCOUNTER — Observation Stay (HOSPITAL_COMMUNITY)
Admission: EM | Admit: 2023-07-28 | Discharge: 2023-07-29 | Disposition: A | Discharge status: Home / Self Care | Payer: Self-pay | Attending: Internal Medicine | Admitting: Internal Medicine

## 2023-07-28 ENCOUNTER — Other Ambulatory Visit: Payer: Self-pay

## 2023-07-28 ENCOUNTER — Encounter (HOSPITAL_COMMUNITY): Payer: Self-pay

## 2023-07-28 DIAGNOSIS — Z79899 Other long term (current) drug therapy: Secondary | ICD-10-CM | POA: Insufficient documentation

## 2023-07-28 DIAGNOSIS — J45901 Unspecified asthma with (acute) exacerbation: Secondary | ICD-10-CM | POA: Diagnosis present

## 2023-07-28 DIAGNOSIS — J4541 Moderate persistent asthma with (acute) exacerbation: Principal | ICD-10-CM | POA: Insufficient documentation

## 2023-07-28 DIAGNOSIS — Z0383 Encounter for observation for suspected conditions related to home physiologic monitoring device ruled out: Secondary | ICD-10-CM | POA: Insufficient documentation

## 2023-07-28 DIAGNOSIS — Z9981 Dependence on supplemental oxygen: Secondary | ICD-10-CM | POA: Insufficient documentation

## 2023-07-28 DIAGNOSIS — J4521 Mild intermittent asthma with (acute) exacerbation: Secondary | ICD-10-CM

## 2023-07-28 DIAGNOSIS — Z1152 Encounter for screening for COVID-19: Secondary | ICD-10-CM | POA: Insufficient documentation

## 2023-07-28 LAB — CBC WITH DIFFERENTIAL/PLATELET
Abs Immature Granulocytes: 0.04 10*3/uL (ref 0.00–0.07)
Basophils Absolute: 0 10*3/uL (ref 0.0–0.1)
Basophils Relative: 0 %
Eosinophils Absolute: 1 10*3/uL — ABNORMAL HIGH (ref 0.0–0.5)
Eosinophils Relative: 9 %
HCT: 47 % (ref 39.0–52.0)
Hemoglobin: 16 g/dL (ref 13.0–17.0)
Immature Granulocytes: 0 %
Lymphocytes Relative: 25 %
Lymphs Abs: 2.8 10*3/uL (ref 0.7–4.0)
MCH: 30.4 pg (ref 26.0–34.0)
MCHC: 34 g/dL (ref 30.0–36.0)
MCV: 89.4 fL (ref 80.0–100.0)
Monocytes Absolute: 0.9 10*3/uL (ref 0.1–1.0)
Monocytes Relative: 8 %
Neutro Abs: 6.2 10*3/uL (ref 1.7–7.7)
Neutrophils Relative %: 58 %
Platelets: 209 10*3/uL (ref 150–400)
RBC: 5.26 MIL/uL (ref 4.22–5.81)
RDW: 12.6 % (ref 11.5–15.5)
WBC: 10.9 10*3/uL — ABNORMAL HIGH (ref 4.0–10.5)
nRBC: 0 % (ref 0.0–0.2)

## 2023-07-28 LAB — BASIC METABOLIC PANEL WITH GFR
Anion gap: 9 (ref 5–15)
BUN: 15 mg/dL (ref 6–20)
CO2: 25 mmol/L (ref 22–32)
Calcium: 9 mg/dL (ref 8.9–10.3)
Chloride: 105 mmol/L (ref 98–111)
Creatinine, Ser: 1.07 mg/dL (ref 0.61–1.24)
GFR, Estimated: >60 mL/min (ref >60)
Glucose, Bld: 108 mg/dL — ABNORMAL HIGH (ref 70–99)
Potassium: 3.7 mmol/L (ref 3.5–5.1)
Sodium: 139 mmol/L (ref 135–145)

## 2023-07-28 LAB — RESP PANEL BY RT-PCR (RSV, FLU A&B, COVID)  RVPGX2
Influenza A by PCR: NEGATIVE
Influenza B by PCR: NEGATIVE
Resp Syncytial Virus by PCR: NEGATIVE
SARS Coronavirus 2 by RT PCR: NEGATIVE

## 2023-07-28 MED ORDER — MAGNESIUM SULFATE 2 GM/50ML IV SOLN
2.0000 g | Freq: Once | INTRAVENOUS | Status: AC
  Administered 2023-07-28: 2 g via INTRAVENOUS
  Filled 2023-07-28: qty 50

## 2023-07-28 MED ORDER — ALBUTEROL SULFATE (2.5 MG/3ML) 0.083% IN NEBU
10.0000 mg/h | INHALATION_SOLUTION | RESPIRATORY_TRACT | Status: AC
  Administered 2023-07-28: 10 mg/h via RESPIRATORY_TRACT
  Filled 2023-07-28: qty 12

## 2023-07-28 MED ORDER — PREDNISONE 20 MG PO TABS
40.0000 mg | ORAL_TABLET | Freq: Every day | ORAL | Status: DC
  Administered 2023-07-29: 40 mg via ORAL
  Filled 2023-07-28: qty 2

## 2023-07-28 MED ORDER — ONDANSETRON HCL 4 MG PO TABS: 4.0000 mg | ORAL_TABLET | Freq: Four times a day (QID) | ORAL | Status: DC | PRN

## 2023-07-28 MED ORDER — ENOXAPARIN SODIUM 40 MG/0.4ML IJ SOSY: 40.0000 mg | PREFILLED_SYRINGE | INTRAMUSCULAR | Status: DC

## 2023-07-28 MED ORDER — METHYLPREDNISOLONE SODIUM SUCC 125 MG IJ SOLR
125.0000 mg | Freq: Once | INTRAMUSCULAR | Status: AC
  Administered 2023-07-28: 125 mg via INTRAVENOUS
  Filled 2023-07-28: qty 2

## 2023-07-28 MED ORDER — SODIUM CHLORIDE 0.9 % IV BOLUS
1000.0000 mL | Freq: Once | INTRAVENOUS | Status: AC
  Administered 2023-07-28: 1000 mL via INTRAVENOUS

## 2023-07-28 MED ORDER — KETOROLAC TROMETHAMINE 15 MG/ML IJ SOLN
15.0000 mg | Freq: Once | INTRAMUSCULAR | Status: AC
  Administered 2023-07-28: 15 mg via INTRAVENOUS
  Filled 2023-07-28: qty 1

## 2023-07-28 MED ORDER — ALBUTEROL SULFATE (2.5 MG/3ML) 0.083% IN NEBU
2.5000 mg | INHALATION_SOLUTION | Freq: Four times a day (QID) | RESPIRATORY_TRACT | Status: DC
  Administered 2023-07-28 – 2023-07-29 (×3): 2.5 mg via RESPIRATORY_TRACT
  Filled 2023-07-28 (×3): qty 3

## 2023-07-28 MED ORDER — ACETAMINOPHEN 650 MG RE SUPP
650.0000 mg | Freq: Four times a day (QID) | RECTAL | Status: DC | PRN
Start: 2023-07-28 — End: 2023-07-29

## 2023-07-28 MED ORDER — POTASSIUM CHLORIDE CRYS ER 20 MEQ PO TBCR
40.0000 meq | EXTENDED_RELEASE_TABLET | Freq: Once | ORAL | Status: AC
  Administered 2023-07-28: 40 meq via ORAL
  Filled 2023-07-28: qty 2

## 2023-07-28 MED ORDER — ONDANSETRON HCL 4 MG/2ML IJ SOLN: 4.0000 mg | Freq: Four times a day (QID) | INTRAMUSCULAR | Status: DC | PRN

## 2023-07-28 MED ORDER — IPRATROPIUM-ALBUTEROL 0.5-2.5 (3) MG/3ML IN SOLN
3.0000 mL | RESPIRATORY_TRACT | Status: AC
  Administered 2023-07-28 (×2): 3 mL via RESPIRATORY_TRACT
  Filled 2023-07-28 (×2): qty 3

## 2023-07-28 MED ORDER — BUTALBITAL-APAP-CAFFEINE 50-325-40 MG PO TABS
1.0000 | ORAL_TABLET | Freq: Once | ORAL | Status: AC
  Administered 2023-07-28: 1 via ORAL
  Filled 2023-07-28: qty 1

## 2023-07-28 MED ORDER — ACETAMINOPHEN 325 MG PO TABS
650.0000 mg | ORAL_TABLET | Freq: Four times a day (QID) | ORAL | Status: DC | PRN
  Administered 2023-07-28: 650 mg via ORAL
  Filled 2023-07-28: qty 2

## 2023-07-28 MED ORDER — ALBUTEROL SULFATE (2.5 MG/3ML) 0.083% IN NEBU
2.5000 mg | INHALATION_SOLUTION | RESPIRATORY_TRACT | Status: DC | PRN
  Filled 2023-07-28: qty 3

## 2023-07-28 NOTE — Plan of Care (Signed)
  Problem: Education: Goal: Knowledge of General Education information will improve Description: Including pain rating scale, medication(s)/side effects and non-pharmacologic comfort measures Outcome: Progressing   Problem: Clinical Measurements: Goal: Diagnostic test results will improve Outcome: Progressing   Problem: Nutrition: Goal: Adequate nutrition will be maintained Outcome: Progressing   Problem: Safety: Goal: Ability to remain free from injury will improve Outcome: Progressing   Problem: Activity: Goal: Ability to tolerate increased activity will improve Outcome: Progressing Goal: Will verbalize the importance of balancing activity with adequate rest periods Outcome: Progressing   Problem: Respiratory: Goal: Ability to maintain adequate ventilation will improve Outcome: Progressing

## 2023-07-28 NOTE — Progress Notes (Signed)
   07/28/23 1730  Assess: MEWS Score  Temp 98 F (36.7 C)  BP 122/67  MAP (mmHg) 81  Pulse Rate (!) 116  Level of Consciousness Alert  SpO2 98 %  O2 Device Nasal Cannula  O2 Flow Rate (L/min) 2 L/min  Assess: MEWS Score  MEWS Temp 0  MEWS Systolic 0  MEWS Pulse 2  MEWS RR 0  MEWS LOC 0  MEWS Score 2  MEWS Score Color Yellow  Assess: if the MEWS score is Yellow or Red  Were vital signs accurate and taken at a resting state? Yes  Does the patient meet 2 or more of the SIRS criteria? No  MEWS guidelines implemented  Yes, yellow  Treat  MEWS Interventions Considered administering scheduled or prn medications/treatments as ordered  Take Vital Signs  Increase Vital Sign Frequency  Yellow: Q2hr x1, continue Q4hrs until patient remains green for 12hrs  Escalate  MEWS: Escalate Yellow: Discuss with charge nurse and consider notifying provider and/or RRT  Notify: Charge Nurse/RN  Name of Charge Nurse/RN Notified Christa RN  Provider Notification  Provider Name/Title Bonita Bussing, MD  Date Provider Notified 07/28/23  Time Provider Notified 1730  Method of Notification Page  Notification Reason Other (Comment) (yellow MEWS)  Provider response See new orders (NS Bolus)  Date of Provider Response 07/28/23  Time of Provider Response 1735  Assess: SIRS CRITERIA  SIRS Temperature  0  SIRS Respirations  0  SIRS Pulse 1  SIRS WBC 0  SIRS Score Sum  1

## 2023-07-28 NOTE — ED Provider Notes (Signed)
 [place] EMERGENCY DEPARTMENT AT [facility] Provider Note   CSN: [id number] Arrival date & time: 07/28/23  0815     History  Chief Complaint  Patient presents with   Headache   Shortness of Breath    Bryan Curry is a 33 y.o. male.  33 yo M with a chief complaints of cough congestion headache.  Started abruptly this morning.  Said he was fine yesterday.  No known sick contacts.  He denies history of asthma, denies smoking history.  He then later tells me that this all started when he was 16 he was told that his lungs were closing and no one knows why.  No breathing medicines at home.  He had called EMS and was given an albuterol  treatment with some mild improvement.        Home Medications Prior to Admission medications   Medication Sig Start Date End Date Taking? Authorizing Provider  albuterol  (PROVENTIL  HFA;VENTOLIN  HFA) 108 (90 BASE) MCG/ACT inhaler Inhale 1-2 puffs into the lungs every 6 (six) hours as needed for wheezing or shortness of breath.    [provider]  cyclobenzaprine  (FLEXERIL ) 5 MG tablet Take 1 tablet (5 mg total) by mouth 3 (three) times daily as needed for muscle spasms. 09/01/21   Dalene Duck, MD  oxyCODONE -acetaminophen  (PERCOCET/ROXICET) 5-325 MG tablet Take 1 tablet by mouth every 4 (four) hours as needed for severe pain. 10/01/16   Long, Joshua G, MD      Allergies    Patient has no known allergies.    Review of Systems   Review of Systems  Physical Exam Updated Vital Signs BP 129/85   Pulse 80   Temp 97.6 F (36.4 C)   Resp (!) 21   Ht 5' 11 (1.803 m)   Wt 90.7 kg   SpO2 94%   BMI 27.89 kg/m  Physical Exam Vitals and nursing note reviewed.  Constitutional:      Appearance: He is well-developed.  HENT:     Head: Normocephalic and atraumatic.  Eyes:     Pupils: Pupils are equal, round, and reactive to light.  Neck:     Vascular: No JVD.  Cardiovascular:     Rate and Rhythm: Normal rate and  regular rhythm.     Heart sounds: No murmur heard.    No friction rub. No gallop.  Pulmonary:     Effort: No respiratory distress.     Breath sounds: Wheezing present.     Comments: Diffuse wheezes and prolonged expiratory effort. Abdominal:     General: There is no distension.     Tenderness: There is no abdominal tenderness. There is no guarding or rebound.  Musculoskeletal:        General: Normal range of motion.     Cervical back: Normal range of motion and neck supple.  Skin:    Coloration: Skin is not pale.     Findings: No rash.  Neurological:     Mental Status: He is alert and oriented to person, place, and time.  Psychiatric:        Behavior: Behavior normal.     ED Results / Procedures / Treatments   Labs (all labs ordered are listed, but only abnormal results are displayed) Labs Reviewed  CBC WITH DIFFERENTIAL/PLATELET - Abnormal; Notable for the following components:      Result Value   WBC 10.9 (*)    Eosinophils Absolute 1.0 (*)    All other components within normal  limits  BASIC METABOLIC PANEL WITH GFR - Abnormal; Notable for the following components:   Glucose, Bld 108 (*)    All other components within normal limits  RESP PANEL BY RT-PCR (RSV, FLU A&B, COVID)  [id number]    EKG None  Radiology DG Chest Port 1 View Result Date: 07/28/2023 CLINICAL DATA:  Shortness of breath. EXAM: PORTABLE CHEST 1 VIEW COMPARISON:  09/01/2021. FINDINGS: The heart size and mediastinal contours are within normal limits for technique. No focal consolidation, sizeable pleural effusion, or pneumothorax. No acute osseous abnormality. IMPRESSION: No acute cardiopulmonary findings. Electronically Signed   By: Mannie Seek M.D.   On: 07/28/2023 09:24    Procedures .Critical Care  Performed by: Albertus Hughs, DO Authorized by: Albertus Hughs, DO   Critical care provider statement:    Critical care time (minutes):  35   Critical care time was exclusive of:  Separately billable  procedures and treating other patients   Critical care was time spent personally by me on the following activities:  Development of treatment plan with patient or surrogate, discussions with consultants, evaluation of patient's response to treatment, examination of patient, ordering and review of laboratory studies, ordering and review of radiographic studies, ordering and performing treatments and interventions, pulse oximetry, re-evaluation of patient's condition and review of old charts   Care discussed with: admitting provider       Medications Ordered in ED Medications  albuterol  (PROVENTIL ) (2.5 MG/3ML) 0.083% nebulizer solution (has no administration in time range)  ipratropium-albuterol  (DUONEB) 0.5-2.5 (3) MG/3ML nebulizer solution 3 mL (3 mLs Nebulization Given 07/28/23 0847)  methylPREDNISolone sodium succinate (SOLU-MEDROL) 125 mg/2 mL injection 125 mg (125 mg Intravenous Given 07/28/23 0841)  magnesium sulfate IVPB 2 g 50 mL (0 g Intravenous Stopped 07/28/23 0900)  butalbital-acetaminophen -caffeine (FIORICET) 50-325-40 MG per tablet 1 tablet (1 tablet Oral Given 07/28/23 0854)    ED Course/ Medical Decision Making/ A&P                                 Medical Decision Making Amount and/or Complexity of Data Reviewed Labs: ordered. Radiology: ordered.  Risk Prescription drug management.   33 yo M with reportedly no history of asthma though has a prescription for an inhaler and tells me that he was told at some point in his life that he had his lungs closing up as a kid.  He comes in with a chief complaint of difficulty breathing.  Diffuse wheezes and prolonged expiratory effort.  Will give 2 DuoNeb's back-to-back Solu-Medrol and magnesium.  Chest x-ray blood work.  Reassess.  Mild leukocytosis.  No significant electrolyte abnormalities.  Chest x-ray independently interpreted by me without obvious focal infiltrate or pneumothorax.  Patient is feeling mildly better on repeat  assessment.  Still having significant difficulty breathing for him.  Still diffuse wheezes with prolonged expiratory effort.  Oxygen saturation has increased a little bit since arrival.  Will place on a continuous neb.  Will discuss with hospitalist for admission.  The patients results and plan were reviewed and discussed.   Any x-rays performed were independently reviewed by myself.   Differential diagnosis were considered with the presenting HPI.  Medications  albuterol  (PROVENTIL ) (2.5 MG/3ML) 0.083% nebulizer solution (has no administration in time range)  ipratropium-albuterol  (DUONEB) 0.5-2.5 (3) MG/3ML nebulizer solution 3 mL (3 mLs Nebulization Given 07/28/23 0847)  methylPREDNISolone sodium succinate (SOLU-MEDROL) 125 mg/2 mL injection 125 mg (125 mg  Intravenous Given 07/28/23 0841)  magnesium sulfate IVPB 2 g 50 mL (0 g Intravenous Stopped 07/28/23 0900)  butalbital-acetaminophen -caffeine (FIORICET) 50-325-40 MG per tablet 1 tablet (1 tablet Oral Given 07/28/23 0854)    Vitals:   07/28/23 0823 07/28/23 0825 07/28/23 0835 07/28/23 0839  BP:  129/85    Pulse:  (!) 125 91 80  Resp:  (!) 25 15 (!) 21  Temp:  97.6 F (36.4 C)    SpO2:  94% 90% 94%  Weight: 90.7 kg     Height: 5' 11 (1.803 m)       Final diagnoses:  Moderate persistent reactive airway disease with acute exacerbation    Admission/ observation were discussed with the admitting physician, patient and/or family and they are comfortable with the plan.          Final Clinical Impression(s) / ED Diagnoses Final diagnoses:  Moderate persistent reactive airway disease with acute exacerbation    Rx / DC Orders ED Discharge Orders     None         Albertus Hughs, DO 07/28/23 [phone]

## 2023-07-28 NOTE — ED Triage Notes (Signed)
 Pt arrives via EMS. Pt reports waking up this morning with a headache and sob. EMS administered 1 duoneb pta. Pt AxOx4.

## 2023-07-28 NOTE — H&P (Signed)
 History and Physical    Patient: Bryan Curry ION:[id number] DOB: 20-Oct-1990 DOA: 07/28/2023 DOS: the patient was seen and examined on 07/28/2023 PCP: Patient, No Pcp Per  Patient coming from: Home  Chief Complaint:  Chief Complaint  Patient presents with   Headache   Shortness of Breath   HPI: Bryan Curry is a 33 y.o. male with medical history significant of asthma who presented to the emergency department with complaints of headache, dyspnea, sinus congestion and wheezing for the past few days.  He has previously used bronchodilators and had a normal prescription for albuterol  in his medication profile.  He has had asthma like symptoms since age 52. He denied fever, chills or hemoptysis.  No chest pain, palpitations, diaphoresis, PND, orthopnea or pitting edema of the lower extremities.  No abdominal pain, nausea, emesis, diarrhea, constipation, melena or hematochezia.  No flank pain, dysuria, frequency or hematuria.  No polyuria, polydipsia, polyphagia or blurred vision.   Lab work: CBC a white count of 10.9, hemoglobin 16.0 g/dL platelets 244.  BMP showed a glucose of 108 mg/dL, was otherwise normal.  Imaging: Portable 1 view chest radiograph showed no acute cardiopulmonary findings.   ED course: Initial vital signs were temperature 97.6 F, pulse 125, respiration 25, BP 129/85 mmHg O2 sat 91% on room air.  The patient received a tablet of Fioricet p.o., a DuoNeb, continuous 10 mg albuterol  neb, magnesium sulfate 2 g IVPB and methylprednisolone 125 mg IVP.  Review of Systems: As mentioned in the history of present illness. All other systems reviewed and are negative. History reviewed. No pertinent past medical history. History reviewed. No pertinent surgical history. Social History:  reports that he has never smoked. He has never used smokeless tobacco. He reports that he does not drink alcohol and does not use drugs.  No Known Allergies  Family History  Problem Relation Age  of Onset   Cancer Paternal Grandfather     Prior to Admission medications   Not on File    Physical Exam: Vitals:   07/28/23 0839 07/28/23 0900 07/28/23 0930 07/28/23 0948  BP:  134/79 122/82   Pulse: 80 (!) 118 (!) 108   Resp: (!) 21 19 17    Temp:      SpO2: 94% 92% 96% 96%  Weight:      Height:       Physical Exam Vitals and nursing note reviewed.  Constitutional:      General: He is awake. He is not in acute distress.    Appearance: He is well-developed. He is ill-appearing.     Interventions: Nasal cannula in place.  HENT:     Head: Normocephalic.     Nose: No rhinorrhea.     Mouth/Throat:     Mouth: Mucous membranes are moist.  Eyes:     General: No scleral icterus.    Pupils: Pupils are equal, round, and reactive to light.  Neck:     Vascular: No JVD.  Cardiovascular:     Rate and Rhythm: Regular rhythm. Tachycardia present.     Heart sounds: S1 normal and S2 normal.  Pulmonary:     Effort: No accessory muscle usage.     Breath sounds: Wheezing present. No rhonchi or rales.  Abdominal:     General: There is no distension.     Palpations: Abdomen is soft.     Tenderness: There is no abdominal tenderness. There is no right CVA tenderness or left CVA tenderness.  Musculoskeletal:  Cervical back: Neck supple.     Right lower leg: No edema.     Left lower leg: No edema.  Skin:    General: Skin is warm and dry.  Neurological:     General: No focal deficit present.     Mental Status: He is alert and oriented to person, place, and time.  Psychiatric:        Mood and Affect: Mood normal.        Behavior: Behavior is cooperative.     Data Reviewed:  Results are pending, will review when available.  Assessment and Plan: Principal Problem:   Asthma exacerbation Observation/telemetry Continue supplemental oxygen. Methylprednisolone 125 mg IVP x1 given. Followed by prednisone  40 mg p.o. daily in a.m. Scheduled and as needed bronchodilators. Follow-up  CBC and chemistry in the morning.     Advance Care Planning:   Code Status: Full Code   Consults:   Family Communication:   Severity of Illness: The appropriate patient status for this patient is OBSERVATION. Observation status is judged to be reasonable and necessary in order to provide the required intensity of service to ensure the patient's safety. The patient's presenting symptoms, physical exam findings, and initial radiographic and laboratory data in the context of their medical condition is felt to place them at decreased risk for further clinical deterioration. Furthermore, it is anticipated that the patient will be medically stable for discharge from the hospital within 2 midnights of admission.   Author: Danice Dural, MD 07/28/2023 10:10 AM  For on call review [email or web address].   This document was prepared using Dragon voice recognition software and may contain some unintended transcription errors.

## 2023-07-29 ENCOUNTER — Other Ambulatory Visit (HOSPITAL_COMMUNITY): Payer: Self-pay

## 2023-07-29 DIAGNOSIS — J4541 Moderate persistent asthma with (acute) exacerbation: Secondary | ICD-10-CM

## 2023-07-29 LAB — COMPREHENSIVE METABOLIC PANEL WITH GFR
ALT: 17 U/L (ref 0–44)
AST: 15 U/L (ref 15–41)
Albumin: 3.6 g/dL (ref 3.5–5.0)
Alkaline Phosphatase: 56 U/L (ref 38–126)
Anion gap: 7 (ref 5–15)
BUN: 16 mg/dL (ref 6–20)
CO2: 22 mmol/L (ref 22–32)
Calcium: 9 mg/dL (ref 8.9–10.3)
Chloride: 109 mmol/L (ref 98–111)
Creatinine, Ser: 0.73 mg/dL (ref 0.61–1.24)
GFR, Estimated: >60 mL/min (ref >60)
Glucose, Bld: 112 mg/dL — ABNORMAL HIGH (ref 70–99)
Potassium: 4.4 mmol/L (ref 3.5–5.1)
Sodium: 138 mmol/L (ref 135–145)
Total Bilirubin: 0.7 mg/dL (ref 0.0–1.2)
Total Protein: 6.9 g/dL (ref 6.5–8.1)

## 2023-07-29 LAB — CBC
HCT: 44.5 % (ref 39.0–52.0)
Hemoglobin: 14.6 g/dL (ref 13.0–17.0)
MCH: 31.1 pg (ref 26.0–34.0)
MCHC: 32.8 g/dL (ref 30.0–36.0)
MCV: 94.7 fL (ref 80.0–100.0)
Platelets: 215 10*3/uL (ref 150–400)
RBC: 4.7 MIL/uL (ref 4.22–5.81)
RDW: 13.1 % (ref 11.5–15.5)
WBC: 17.7 10*3/uL — ABNORMAL HIGH (ref 4.0–10.5)
nRBC: 0 % (ref 0.0–0.2)

## 2023-07-29 LAB — HIV ANTIBODY (ROUTINE TESTING W REFLEX): HIV Screen 4th Generation wRfx: NONREACTIVE

## 2023-07-29 MED ORDER — PREDNISONE 20 MG PO TABS
40.0000 mg | ORAL_TABLET | Freq: Every day | ORAL | 0 refills | Status: AC
  Filled 2023-07-29: qty 10, 5d supply, fill #0

## 2023-07-29 MED ORDER — ALBUTEROL SULFATE HFA 108 (90 BASE) MCG/ACT IN AERS
2.0000 | INHALATION_SPRAY | Freq: Four times a day (QID) | RESPIRATORY_TRACT | 0 refills | Status: AC | PRN
  Filled 2023-07-29: qty 6.7, 25d supply, fill #0

## 2023-07-29 NOTE — TOC Initial Note (Signed)
 Transition of Care [facility]) - Initial/Assessment Note    Patient Details  Name: Bryan Curry MRN: [id number] Date of Birth: [birth year]  [facility] CM/SW Contact:    Jonni Nettle, LCSW Phone Number: 07/29/2023, 9:59 AM  Clinical Narrative:                 TOC consulted to establish PCP appointment upon discharge. CSW met with pt at bedside to discuss PCP appointment. Pt agreeable to scheduling PCP appointment. Pt has no active insurance coverage. CSW scheduled new patient appointment with JoAnna Williamson, on 09/07/2023 at 2:20 PM at [facility]. Appointment information added to AVS. No further TOC needs at this time.    Expected Discharge Plan: Home/Self Care Barriers to Discharge: Barriers Resolved   Patient Goals and CMS Choice Patient states their goals for this hospitalization and ongoing recovery are:: To return home        Expected Discharge Plan and Services In-house Referral: Clinical Social Work   Post Acute Care Choice: NA Living arrangements for the past 2 months: Single Family Home Expected Discharge Date: 07/29/23                 Prior Living Arrangements/Services Living arrangements for the past 2 months: Single Family Home Lives with:: Self, Minor Children Patient language and need for interpreter reviewed:: Yes Do you feel safe going back to the place where you live?: Yes      Need for Family Participation in Patient Care: No (Comment) Care giver support system in place?: No (comment)   Criminal Activity/Legal Involvement Pertinent to Current Situation/Hospitalization: No - Comment as needed  Activities of Daily Living   ADL Screening (condition at time of admission) Independently performs ADLs?: Yes (appropriate for developmental age) Is the patient deaf or have difficulty hearing?: No Does the patient have difficulty seeing, even when wearing glasses/contacts?: No Does the patient have difficulty  concentrating, remembering, or making decisions?: No   Emotional Assessment Appearance:: Appears stated age Attitude/Demeanor/Rapport: Engaged Affect (typically observed): Pleasant, Stable, Appropriate Orientation: : Oriented to Self, Oriented to Place, Oriented to  Time, Oriented to Situation Alcohol / Substance Use: Not Applicable Psych Involvement: No (comment)  Admission diagnosis:  Asthma exacerbation [J45.901] Moderate persistent reactive airway disease with acute exacerbation [J45.41] Patient Active Problem List   Diagnosis Date Noted   Asthma exacerbation 07/28/2023   Seizure (HCC) 10/20/2014   Tinea versicolor 04/25/2013   PCP:  Patient, No Pcp Per Pharmacy:   [id number] - [place], [place] - [address] [place] [address] Phone: [phone] Fax: [phone]  [id number] [place], [place] - [address] [facility] [address] [facility] [address] [facility] [address] [place] [address] Phone: [phone] Fax: [phone]  [facility]ode[facility] Spruce [facility] - [facility] [address] [place] [address] Phone: [phone] Fax: [phone]     Social Drivers of Health (SDOH) Social History: SDOH Screenings   Food Insecurity: No Food Insecurity (07/28/2023)  Housing: Low Risk  (07/28/2023)  Transportation Needs: No Transportation Needs (07/28/2023)  Utilities: Not At Risk (07/28/2023)  Tobacco Use: Low Risk  (07/28/2023)   SDOH Interventions: None indicated     Readmission Risk Interventions     No data to display          [place], MSW, LCSW 07/29/2023 10:02 AM

## 2023-07-29 NOTE — Plan of Care (Signed)
  Problem: Education: Goal: Knowledge of General Education information will improve Description: Including pain rating scale, medication(s)/side effects and non-pharmacologic comfort measures Outcome: Progressing   Problem: Clinical Measurements: Goal: Will remain free from infection Outcome: Progressing    Problem: Pain Managment: Goal: General experience of comfort will improve and/or be controlled Outcome: Progressing   Problem: Safety: Goal: Ability to remain free from injury will improve Outcome: Progressing   Problem: Respiratory: Goal: Levels of oxygenation will improve Outcome: Progressing Goal: Ability to maintain adequate ventilation will improve Outcome: Progressing

## 2023-07-29 NOTE — Progress Notes (Signed)
SATURATION QUALIFICATIONS: (This note is used to comply with regulatory documentation for home oxygen)  Patient Saturations on Room Air at Rest = 94%  Patient Saturations on Room Air while Ambulating = 93%  Patient Saturations on 0 Liters of oxygen while Ambulating = 93%  Please briefly explain why patient needs home oxygen:

## 2023-07-29 NOTE — Discharge Summary (Signed)
 Physician Discharge Summary  Bryan Curry YNW:[id number] DOB: [birth year] DOA: 07/28/2023  PCP: Patient, No Pcp Per  Admit date: 07/28/2023 Discharge date: 07/29/2023  Admitted From: Home Discharge disposition: Home   Recommendations for Outpatient Follow-Up:   Establish with PCP   Discharge Diagnosis:   Principal Problem:   Asthma exacerbation    Discharge Condition: Improved.  Diet recommendation:  Regular.  Wound care: None.  Code status: Full.   History of Present Illness:    Bryan Curry is a 33 y.o. male with medical history significant of asthma who presented to the emergency department with complaints of headache, dyspnea, sinus congestion and wheezing for the past few days.  He has previously used bronchodilators and had a normal prescription for albuterol  in his medication profile.  He has had asthma like symptoms since age 80. He denied fever, chills or hemoptysis.  No chest pain, palpitations, diaphoresis, PND, orthopnea or pitting edema of the lower extremities.  No abdominal pain, nausea, emesis, diarrhea, constipation, melena or hematochezia.  No flank pain, dysuria, frequency or hematuria.  No polyuria, polydipsia, polyphagia or blurred vision.    Hospital Course by Problem:    Asthma exacerbation Continue supplemental oxygen. Methylprednisolone 125 mg IVP x1 given. Followed by prednisone  40 mg p.o. daily in a.m.  as needed bronchodilators. - Much improved on day of discharge with no wheezing and no hypoxia     Medical Consultants:      Discharge Exam:   Vitals:   07/29/23 0432 07/29/23 0750  BP: (!) 145/78   Pulse: 92   Resp: 18   Temp: 98.1 F (36.7 C)   SpO2: 98% 98%   Vitals:   07/28/23 2133 07/29/23 0008 07/29/23 0432 07/29/23 0750  BP:  134/71 (!) 145/78   Pulse:  87 92   Resp:  (!) 21 18   Temp:  98 F (36.7 C) 98.1 F (36.7 C)   TempSrc:  Oral Oral   SpO2: 94% 95% 98% 98%  Weight:      Height:         General exam: Appears calm and comfortable.   The results of significant diagnostics from this hospitalization (including imaging, microbiology, ancillary and laboratory) are listed below for reference.     Procedures and Diagnostic Studies:   DG Chest Port 1 View Result Date: 07/28/2023 CLINICAL DATA:  Shortness of breath. EXAM: PORTABLE CHEST 1 VIEW COMPARISON:  09/01/2021. FINDINGS: The heart size and mediastinal contours are within normal limits for technique. No focal consolidation, sizeable pleural effusion, or pneumothorax. No acute osseous abnormality. IMPRESSION: No acute cardiopulmonary findings. Electronically Signed   By: Mannie Seek M.D.   On: 07/28/2023 09:24     Labs:   Basic Metabolic Panel: Recent Labs  Lab 07/28/23 0833 07/29/23 0549  NA 139 138  K 3.7 4.4  CL 105 109  CO2 25 22  GLUCOSE 108* 112*  BUN 15 16  CREATININE 1.07 0.73  CALCIUM 9.0 9.0   GFR Estimated Creatinine Clearance: 155.6 mL/min (by C-G formula based on SCr of 0.73 mg/dL). Liver Function Tests: Recent Labs  Lab 07/29/23 0549  AST 15  ALT 17  ALKPHOS 56  BILITOT 0.7  PROT 6.9  ALBUMIN 3.6   No results for input(s): LIPASE, AMYLASE in the last 168 hours. No results for input(s): AMMONIA in the last 168 hours. Coagulation profile No results for input(s): INR, PROTIME in the last 168 hours.  CBC: Recent Labs  Lab 07/28/23 0833 07/29/23 0549  WBC 10.9* 17.7*  NEUTROABS 6.2  --   HGB 16.0 14.6  HCT 47.0 44.5  MCV 89.4 94.7  PLT 209 215   Cardiac Enzymes: No results for input(s): CKTOTAL, CKMB, CKMBINDEX, TROPONINI in the last 168 hours. BNP: Invalid input(s): POCBNP CBG: No results for input(s): GLUCAP in the last 168 hours. D-Dimer No results for input(s): DDIMER in the last 72 hours. Hgb A1c No results for input(s): HGBA1C in the last 72 hours. Lipid Profile No results for input(s): CHOL, HDL, LDLCALC, TRIG, CHOLHDL,  LDLDIRECT in the last 72 hours. Thyroid function studies No results for input(s): TSH, T4TOTAL, T3FREE, THYROIDAB in the last 72 hours.  Invalid input(s): FREET3 Anemia work up No results for input(s): VITAMINB12, FOLATE, FERRITIN, TIBC, IRON, RETICCTPCT in the last 72 hours. Microbiology Recent Results (from the past 240 hours)  Resp panel by RT-PCR (RSV, Flu A&B, Covid) Anterior Nasal Swab     Status: None   Collection Time: 07/28/23  9:30 AM   Specimen: Anterior Nasal Swab  Result Value Ref Range Status   SARS Coronavirus 2 by RT PCR NEGATIVE NEGATIVE Final    Comment: (NOTE) SARS-CoV-2 target nucleic acids are NOT DETECTED.  The SARS-CoV-2 RNA is generally detectable in upper respiratory specimens during the acute phase of infection. The lowest concentration of SARS-CoV-2 viral copies this assay can detect is 138 copies/mL. A negative result does not preclude SARS-Cov-2 infection and should not be used as the sole basis for treatment or other patient management decisions. A negative result may occur with  improper specimen collection/handling, submission of specimen other than nasopharyngeal swab, presence of viral mutation(s) within the areas targeted by this assay, and inadequate number of viral copies(<138 copies/mL). A negative result must be combined with clinical observations, patient history, and epidemiological information. The expected result is Negative.  Fact Sheet for Patients:  BloggerCourse.com  Fact Sheet for Healthcare Providers:  SeriousBroker.it  This test is no t yet approved or cleared by the [place]  FDA and  has been authorized for detection and/or diagnosis of SARS-CoV-2 by FDA under an Emergency Use Authorization (EUA). This EUA will remain  in effect (meaning this test can be used) for the duration of the COVID-19 declaration under Section 564(b)(1) of the Act,  21 U.S.C.section 360bbb-3(b)(1), unless the authorization is terminated  or revoked sooner.       Influenza A by PCR NEGATIVE NEGATIVE Final   Influenza B by PCR NEGATIVE NEGATIVE Final    Comment: (NOTE) The Xpert Xpress SARS-CoV-2/FLU/RSV plus assay is intended as an aid in the diagnosis of influenza from Nasopharyngeal swab specimens and should not be used as a sole basis for treatment. Nasal washings and aspirates are unacceptable for Xpert Xpress SARS-CoV-2/FLU/RSV testing.  Fact Sheet for Patients: BloggerCourse.com  Fact Sheet for Healthcare Providers: SeriousBroker.it  This test is not yet approved or cleared by the [place]  FDA and has been authorized for detection and/or diagnosis of SARS-CoV-2 by FDA under an Emergency Use Authorization (EUA). This EUA will remain in effect (meaning this test can be used) for the duration of the COVID-19 declaration under Section 564(b)(1) of the Act, 21 U.S.C. section 360bbb-3(b)(1), unless the authorization is terminated or revoked.     Resp Syncytial Virus by PCR NEGATIVE NEGATIVE Final    Comment: (NOTE) Fact Sheet for Patients: BloggerCourse.com  Fact Sheet for Healthcare Providers: SeriousBroker.it  This test is not yet approved or cleared by the [place]   FDA and has been authorized for detection and/or diagnosis of SARS-CoV-2 by FDA under an Emergency Use Authorization (EUA). This EUA will remain in effect (meaning this test can be used) for the duration of the COVID-19 declaration under Section 564(b)(1) of the Act, 21 U.S.C. section 360bbb-3(b)(1), unless the authorization is terminated or revoked.  Performed at [facility], [address], [place], [place] [address]      Discharge Instructions:   Discharge Instructions     Diet general   Complete by: As directed    Increase  activity slowly   Complete by: As directed       Allergies as of 07/29/2023   No Known Allergies      Medication List     TAKE these medications    albuterol  108 (90 Base) MCG/ACT inhaler Commonly known as: VENTOLIN  HFA Inhale 2 puffs into the lungs every 6 (six) hours as needed for wheezing or shortness of breath.   predniSONE  20 MG tablet Commonly known as: DELTASONE  Take 2 tablets (40 mg total) by mouth daily with breakfast.          Time coordinating discharge: 45 min  Signed:  Enrigue Harvard DO  [facility] 07/29/2023, 8:14 AM

## 2023-09-07 ENCOUNTER — Ambulatory Visit: Payer: Self-pay | Admitting: Family Medicine

## 2023-10-20 ENCOUNTER — Other Ambulatory Visit (HOSPITAL_COMMUNITY): Payer: Self-pay

## 2024-01-24 ENCOUNTER — Other Ambulatory Visit: Payer: Self-pay
